# Patient Record
Sex: Female | Born: 1970
Health system: Southern US, Community
[De-identification: ages and names within clinical notes are randomized; demographics above are authoritative.]

## PROBLEM LIST (undated history)

## (undated) DIAGNOSIS — R9439 Abnormal result of other cardiovascular function study: Secondary | ICD-10-CM

## (undated) DIAGNOSIS — F419 Anxiety disorder, unspecified: Secondary | ICD-10-CM

## (undated) DIAGNOSIS — E559 Vitamin D deficiency, unspecified: Secondary | ICD-10-CM

## (undated) DIAGNOSIS — R0789 Other chest pain: Secondary | ICD-10-CM

## (undated) DIAGNOSIS — F329 Major depressive disorder, single episode, unspecified: Secondary | ICD-10-CM

## (undated) DIAGNOSIS — H539 Unspecified visual disturbance: Secondary | ICD-10-CM

## (undated) DIAGNOSIS — R079 Chest pain, unspecified: Secondary | ICD-10-CM

## (undated) DIAGNOSIS — R51 Headache: Secondary | ICD-10-CM

## (undated) DIAGNOSIS — E6609 Other obesity due to excess calories: Secondary | ICD-10-CM

## (undated) DIAGNOSIS — R0609 Other forms of dyspnea: Secondary | ICD-10-CM

## (undated) DIAGNOSIS — R0600 Dyspnea, unspecified: Secondary | ICD-10-CM

## (undated) DIAGNOSIS — I10 Essential (primary) hypertension: Secondary | ICD-10-CM

## (undated) DIAGNOSIS — R55 Syncope and collapse: Secondary | ICD-10-CM

## (undated) DIAGNOSIS — E66812 Obesity, class 2: Secondary | ICD-10-CM

## (undated) DIAGNOSIS — R519 Headache, unspecified: Secondary | ICD-10-CM

## (undated) DIAGNOSIS — Z6838 Body mass index (BMI) 38.0-38.9, adult: Secondary | ICD-10-CM

## (undated) HISTORY — DX: Headache, unspecified: R51.9

## (undated) HISTORY — DX: Headache: R51

## (undated) HISTORY — PX: WISDOM TOOTH EXTRACTION: SHX21

## (undated) HISTORY — DX: Major depressive disorder, single episode, unspecified: F32.9

## (undated) HISTORY — DX: Vitamin D deficiency, unspecified: E55.9

## (undated) HISTORY — DX: Body mass index (BMI) 38.0-38.9, adult: Z68.38

## (undated) HISTORY — DX: Unspecified visual disturbance: H53.9

## (undated) HISTORY — DX: Other chest pain: R07.89

## (undated) HISTORY — PX: BREAST REDUCTION SURGERY: SHX8

## (undated) HISTORY — DX: Other forms of dyspnea: R06.09

## (undated) HISTORY — DX: Essential (primary) hypertension: I10

## (undated) HISTORY — DX: Anxiety disorder, unspecified: F41.9

## (undated) HISTORY — PX: REDUCTION MAMMAPLASTY: SUR839

## (undated) HISTORY — DX: Other obesity due to excess calories: E66.09

## (undated) HISTORY — DX: Chest pain, unspecified: R07.9

## (undated) HISTORY — DX: Dyspnea, unspecified: R06.00

## (undated) HISTORY — DX: Abnormal result of other cardiovascular function study: R94.39

## (undated) HISTORY — DX: Obesity, class 2: E66.812

## (undated) HISTORY — DX: Syncope and collapse: R55

---

## 1998-10-22 ENCOUNTER — Emergency Department (HOSPITAL_COMMUNITY): Admission: EM | Admit: 1998-10-22 | Discharge: 1998-10-22 | Payer: Self-pay

## 2003-07-24 ENCOUNTER — Inpatient Hospital Stay (HOSPITAL_COMMUNITY): Admission: AD | Admit: 2003-07-24 | Discharge: 2003-07-24 | Payer: Self-pay | Admitting: Obstetrics and Gynecology

## 2003-11-30 ENCOUNTER — Inpatient Hospital Stay (HOSPITAL_COMMUNITY): Admission: AD | Admit: 2003-11-30 | Discharge: 2003-11-30 | Payer: Self-pay | Admitting: Obstetrics & Gynecology

## 2004-02-04 ENCOUNTER — Inpatient Hospital Stay (HOSPITAL_COMMUNITY): Admission: AD | Admit: 2004-02-04 | Discharge: 2004-02-04 | Payer: Self-pay | Admitting: Obstetrics & Gynecology

## 2009-07-08 ENCOUNTER — Emergency Department (HOSPITAL_COMMUNITY): Admission: EM | Admit: 2009-07-08 | Discharge: 2009-07-08 | Payer: Self-pay | Admitting: Emergency Medicine

## 2009-07-22 ENCOUNTER — Encounter: Admission: RE | Admit: 2009-07-22 | Discharge: 2009-07-22 | Payer: Self-pay | Admitting: Family Medicine

## 2009-08-23 ENCOUNTER — Ambulatory Visit (HOSPITAL_COMMUNITY): Admission: RE | Admit: 2009-08-23 | Discharge: 2009-08-23 | Payer: Self-pay | Admitting: Gastroenterology

## 2010-04-25 LAB — URINALYSIS, ROUTINE W REFLEX MICROSCOPIC
Bilirubin Urine: NEGATIVE
Glucose, UA: NEGATIVE mg/dL
Ketones, ur: NEGATIVE mg/dL
Leukocytes, UA: NEGATIVE
Nitrite: NEGATIVE
Protein, ur: 30 mg/dL — AB
Specific Gravity, Urine: 1.005 (ref 1.005–1.030)
Urobilinogen, UA: 0.2 mg/dL (ref 0.0–1.0)
pH: 6 (ref 5.0–8.0)

## 2010-04-25 LAB — DIFFERENTIAL
Basophils Absolute: 0.1 10*3/uL (ref 0.0–0.1)
Eosinophils Absolute: 0.1 10*3/uL (ref 0.0–0.7)
Eosinophils Relative: 1 % (ref 0–5)
Lymphocytes Relative: 15 % (ref 12–46)
Lymphs Abs: 2 10*3/uL (ref 0.7–4.0)
Monocytes Absolute: 1.1 10*3/uL — ABNORMAL HIGH (ref 0.1–1.0)

## 2010-04-25 LAB — BASIC METABOLIC PANEL
BUN: 22 mg/dL (ref 6–23)
Chloride: 101 mEq/L (ref 96–112)
GFR calc non Af Amer: 23 mL/min — ABNORMAL LOW (ref >60)
Glucose, Bld: 101 mg/dL — ABNORMAL HIGH (ref 70–99)
Potassium: 4.7 mEq/L (ref 3.5–5.1)
Sodium: 133 mEq/L — ABNORMAL LOW (ref 135–145)

## 2010-04-25 LAB — URINE MICROSCOPIC-ADD ON

## 2010-04-25 LAB — CBC
HCT: 37.7 % (ref 36.0–46.0)
Hemoglobin: 13 g/dL (ref 12.0–15.0)
MCV: 90 fL (ref 78.0–100.0)
Platelets: 256 10*3/uL (ref 150–400)
RDW: 13.1 % (ref 11.5–15.5)

## 2011-05-28 IMAGING — NM NM HEPATO W/GB/PHARM/[PERSON_NAME]
2 series · 12 of 12 positions shown · non-contrast
Comparison: None.

CLINICAL DATA: Abdominal pain

NUCLEAR MEDICINE HEPATOBILIARY IMAGING WITH GALLBLADDER EF
TECHNIQUE: Sequential images of the abdomen were obtained [DATE] minutes following intravenous administration of
radiopharmaceutical.  After slow intravenous infusion of
micrograms Cholecystokinin, gallbladder ejection fraction was
determined.
Radiopharmaceutical:  5.3 mCi Wc-VVm Choletec

[Series 1: he hepato · 4.75mm/px · 6 of 60 frames shown (1 of 2)]
[frame 6/60]
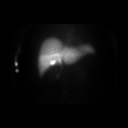
[frame 16/60]
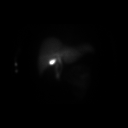
[frame 26/60]
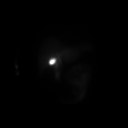
[frame 36/60]
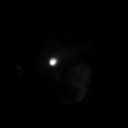
[frame 46/60]
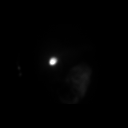
[frame 56/60]
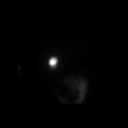

[Series 1: he hepato · 4.72mm/px · 6 of 30 frames shown (2 of 2)]
[frame 3/30]
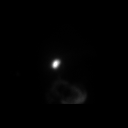
[frame 8/30]
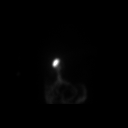
[frame 13/30]
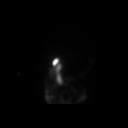
[frame 18/30]
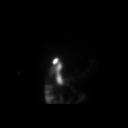
[frame 23/30]
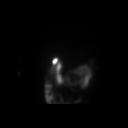
[frame 28/30]
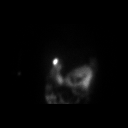

[12 of 12 positions shown; findings below may reference images not displayed]

FINDINGS: There is prompt visualization of the biliary tree,
gallbladder, and small bowel.

Gallbladder ejection fraction is estimated at 73.8%.  Normal is
greater than 30%.

The patient did experience symptoms during CCK infusion.
IMPRESSION: 1.  Patent cystic and common bile ducts.
2.  The gallbladder contracts physiologically.
3.  Subjectively, the patient reported pain during CCK infusion.

## 2011-05-28 IMAGING — US US ABDOMEN COMPLETE
1 series · 14 of 25 positions shown · non-contrast
Comparison: [HOSPITAL] at [HOSPITAL] abdominal pelvic
CT 07/22/2009.

CLINICAL DATA: Abdominal pain since [DATE].

COMPLETE ABDOMINAL ULTRASOUND

[Series 1: us abdomen complete · 0.30mm/px · 14 of 78 slices shown]
[im 1/78]
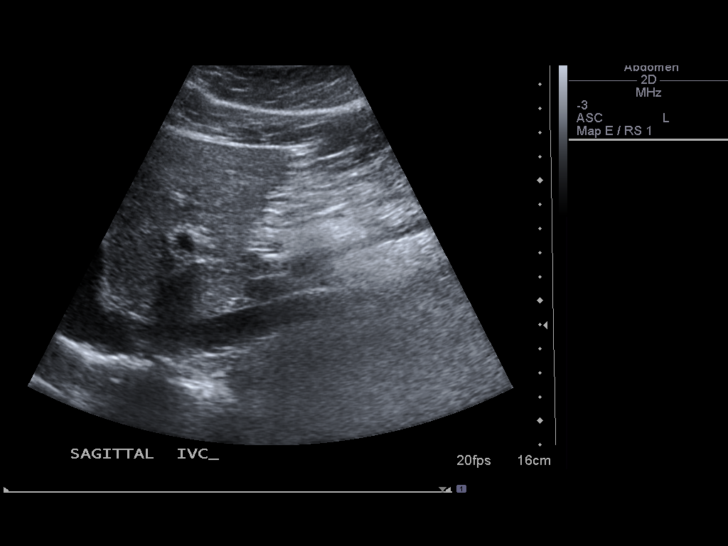
[im 7/78]
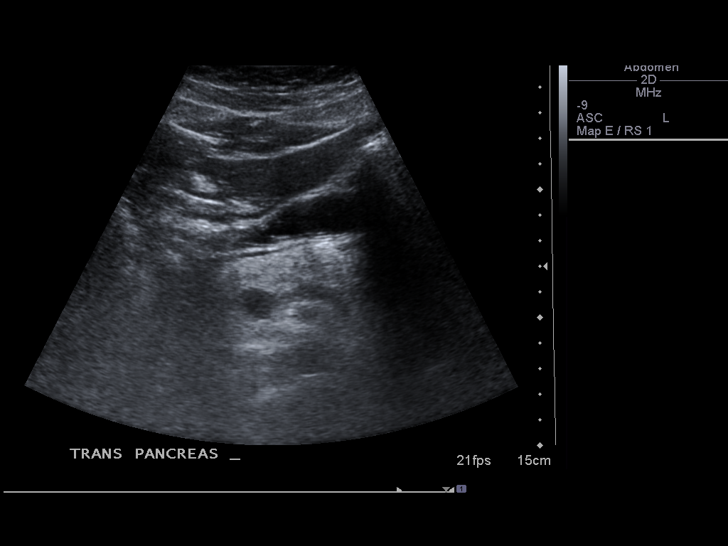
[im 13/78]
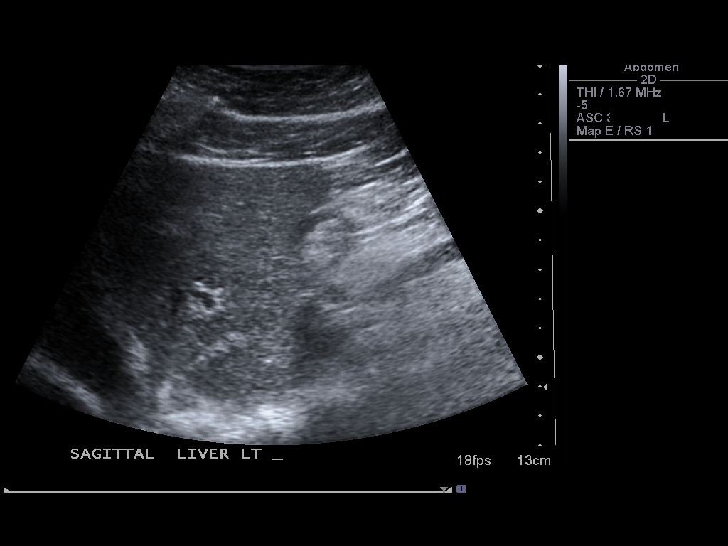
[im 20/78]
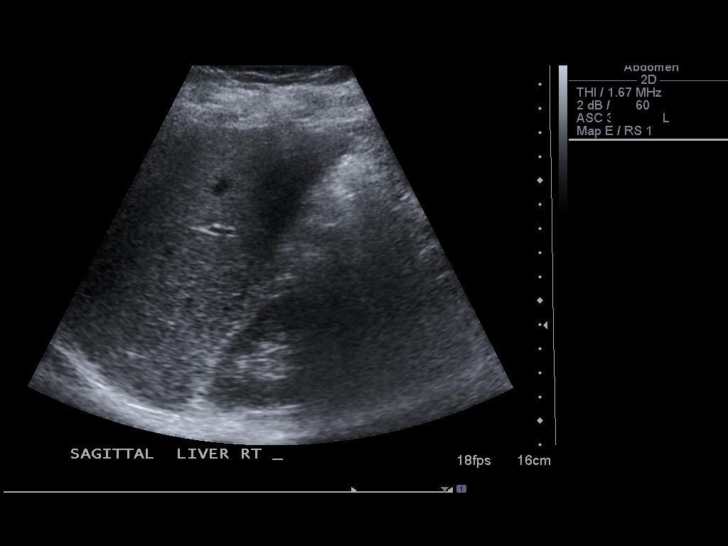
[im 26/78]
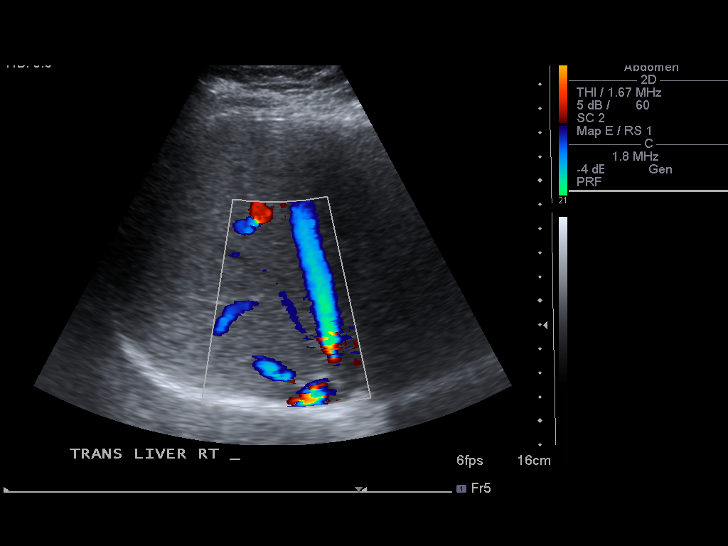
[im 29/78]
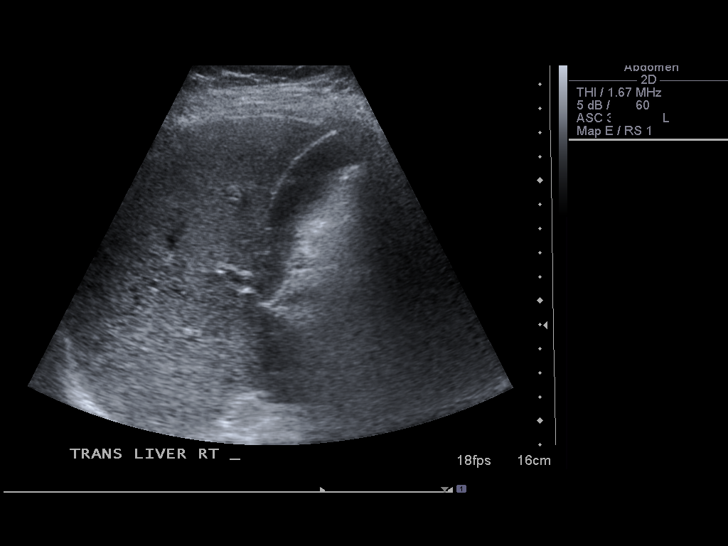
[im 36/78]
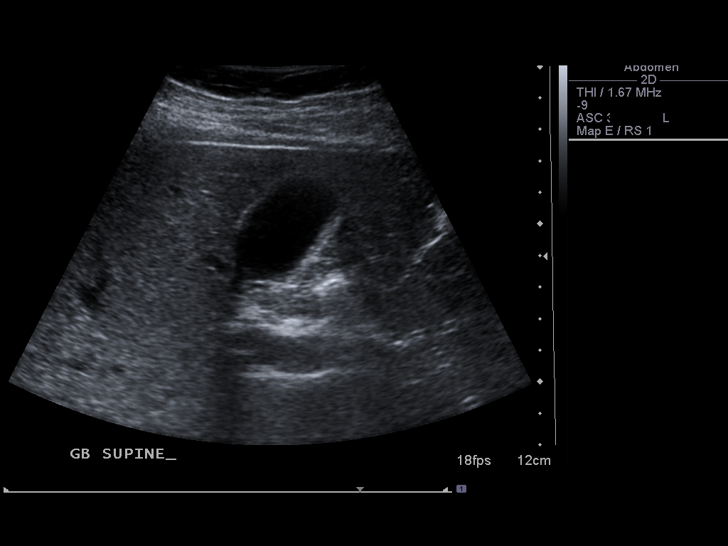
[im 42/78]
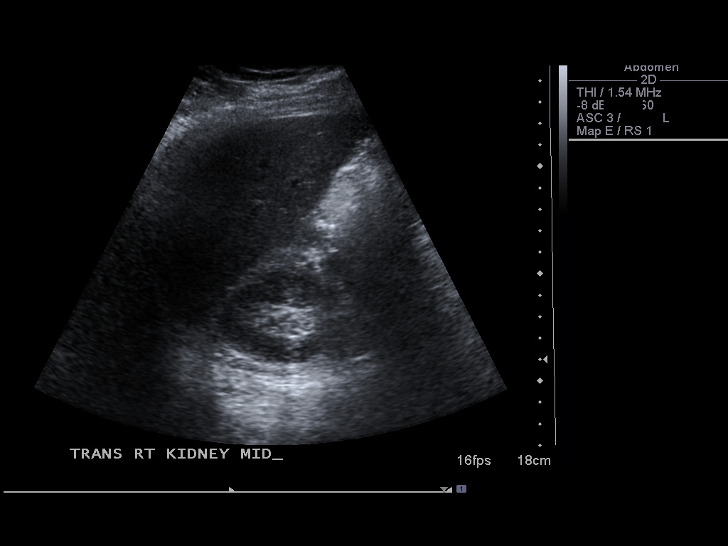
[im 49/78]
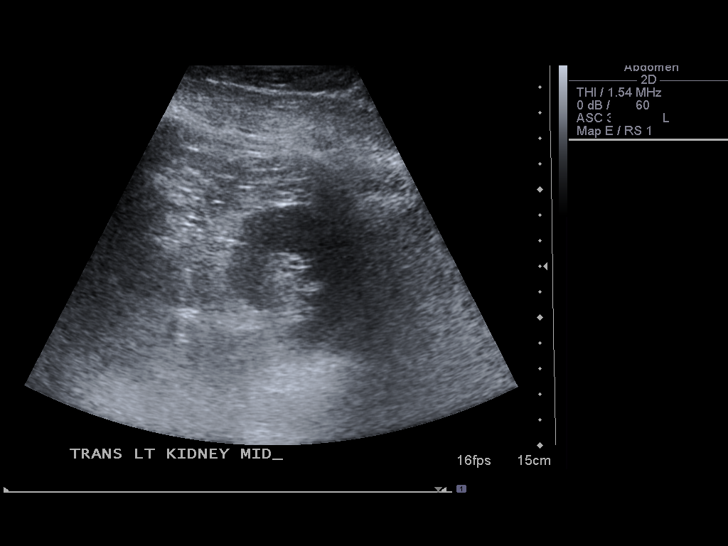
[im 52/78]
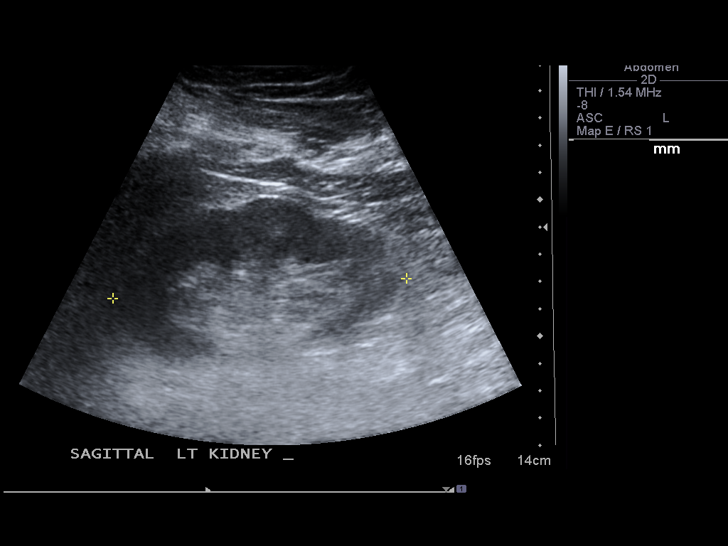
[im 58/78]
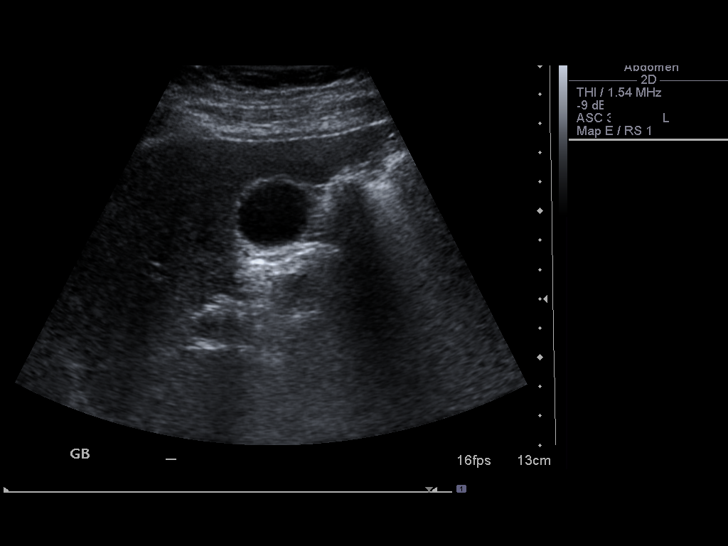
[im 65/78]
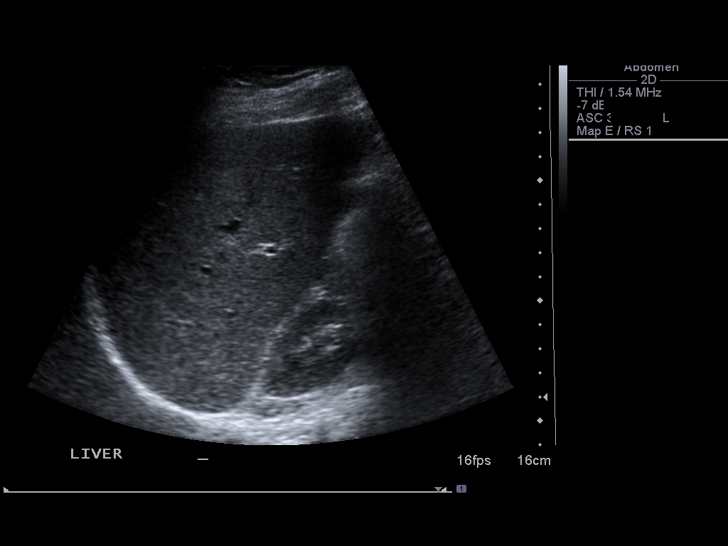
[im 71/78]
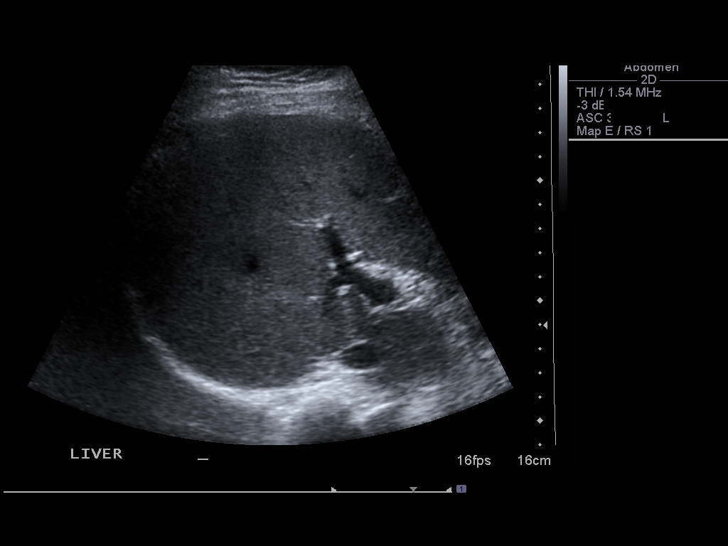
[im 78/78]
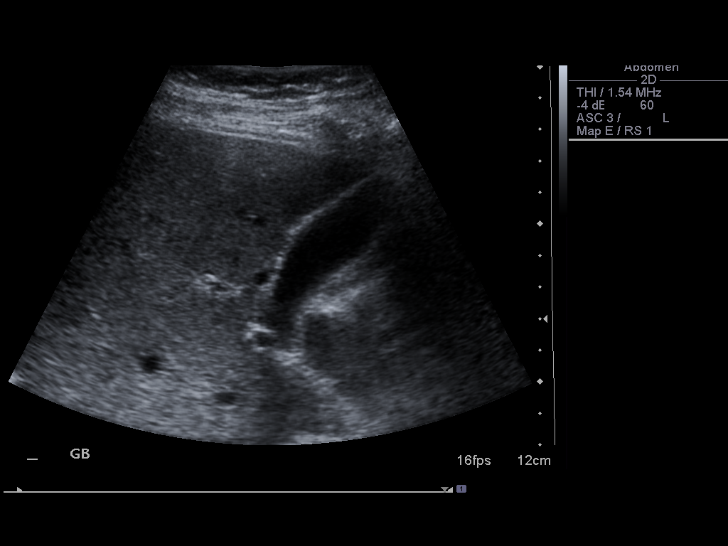

[14 of 25 positions shown; findings below may reference images not displayed]

FINDINGS: Gallbladder:  Sonographically normal without sludge gallstones with
normal 2 mm wall thickness and no sonographic Murphy's sign evoked.

Common bile duct:  No dilated intrahepatic or exophytic bile ducts
with common bile duct measuring normally at 2 mm.

Liver:  Remains normal-appearing.

IVC:  Appears normal.

Pancreas:  Pancreatic tail not visualized due to overlying gastro
intestinal gas with stable 1.5 cm AP thickness pancreatic body with
no focal lesion.

Spleen:  Sonographically normal measuring 7.3 cm long.

Right Kidney:  Sonographically normal measuring 11.9 cm long.

Left Kidney:  Sonographically normal measuring 10.8 cm long with
stable lateral fetal lobulation of contour.

Abdominal aorta:  No aneurysm identified with maximum proximal
diameter 2.1 cm.

No free fluid noted.
IMPRESSION: 1.  Nonvisualization pancreatic tail.
2.  Otherwise stable - normal.

## 2011-08-23 ENCOUNTER — Other Ambulatory Visit: Payer: Self-pay | Admitting: Family Medicine

## 2011-08-23 DIAGNOSIS — N6320 Unspecified lump in the left breast, unspecified quadrant: Secondary | ICD-10-CM

## 2011-08-28 ENCOUNTER — Other Ambulatory Visit: Payer: Self-pay

## 2011-08-29 ENCOUNTER — Ambulatory Visit
Admission: RE | Admit: 2011-08-29 | Discharge: 2011-08-29 | Disposition: A | Discharge status: Home / Self Care | Payer: Managed Care, Other (non HMO) | Source: Ambulatory Visit | Attending: Family Medicine | Admitting: Family Medicine

## 2011-08-29 DIAGNOSIS — N6320 Unspecified lump in the left breast, unspecified quadrant: Secondary | ICD-10-CM

## 2011-12-12 ENCOUNTER — Emergency Department (HOSPITAL_COMMUNITY)
Admission: EM | Admit: 2011-12-12 | Discharge: 2011-12-12 | Disposition: A | Discharge status: Home / Self Care | Payer: Managed Care, Other (non HMO) | Attending: Emergency Medicine | Admitting: Emergency Medicine

## 2011-12-12 ENCOUNTER — Encounter (HOSPITAL_COMMUNITY): Payer: Self-pay | Admitting: *Deleted

## 2011-12-12 ENCOUNTER — Emergency Department (HOSPITAL_COMMUNITY): Payer: Managed Care, Other (non HMO)

## 2011-12-12 DIAGNOSIS — J069 Acute upper respiratory infection, unspecified: Secondary | ICD-10-CM | POA: Insufficient documentation

## 2011-12-12 DIAGNOSIS — Z79899 Other long term (current) drug therapy: Secondary | ICD-10-CM | POA: Insufficient documentation

## 2011-12-12 DIAGNOSIS — J029 Acute pharyngitis, unspecified: Secondary | ICD-10-CM | POA: Insufficient documentation

## 2011-12-12 DIAGNOSIS — R42 Dizziness and giddiness: Secondary | ICD-10-CM

## 2011-12-12 DIAGNOSIS — F17200 Nicotine dependence, unspecified, uncomplicated: Secondary | ICD-10-CM | POA: Insufficient documentation

## 2011-12-12 DIAGNOSIS — R509 Fever, unspecified: Secondary | ICD-10-CM | POA: Insufficient documentation

## 2011-12-12 LAB — BASIC METABOLIC PANEL
BUN: 6 mg/dL (ref 6–23)
Calcium: 9.5 mg/dL (ref 8.4–10.5)
Creatinine, Ser: 0.75 mg/dL (ref 0.50–1.10)
GFR calc non Af Amer: >90 mL/min (ref >90)
Glucose, Bld: 74 mg/dL (ref 70–99)

## 2011-12-12 LAB — CBC WITH DIFFERENTIAL/PLATELET
Basophils Relative: 0 % (ref 0–1)
Eosinophils Absolute: 0.1 10*3/uL (ref 0.0–0.7)
Eosinophils Relative: 1 % (ref 0–5)
Hemoglobin: 14.4 g/dL (ref 12.0–15.0)
Lymphs Abs: 2.1 10*3/uL (ref 0.7–4.0)
MCH: 32.4 pg (ref 26.0–34.0)
MCHC: 34.4 g/dL (ref 30.0–36.0)
MCV: 94.2 fL (ref 78.0–100.0)
Monocytes Relative: 10 % (ref 3–12)
RBC: 4.45 MIL/uL (ref 3.87–5.11)

## 2011-12-12 MED ORDER — IPRATROPIUM BROMIDE 0.03 % NA SOLN: 2.0000 | Freq: Two times a day (BID) | NASAL | Status: DC

## 2011-12-12 MED ORDER — SODIUM CHLORIDE 0.9 % IV SOLN
Freq: Once | INTRAVENOUS | Status: AC
  Administered 2011-12-12: 17:00:00 via INTRAVENOUS

## 2011-12-12 MED ORDER — GUAIFENESIN ER 600 MG PO TB12: 1200.0000 mg | ORAL_TABLET | Freq: Two times a day (BID) | ORAL | Status: DC

## 2011-12-12 MED ORDER — BENZONATATE 100 MG PO CAPS: 200.0000 mg | ORAL_CAPSULE | Freq: Two times a day (BID) | ORAL | Status: DC | PRN

## 2011-12-12 MED ORDER — SODIUM CHLORIDE 0.9 % IV BOLUS (SEPSIS)
1000.0000 mL | Freq: Once | INTRAVENOUS | Status: AC
  Administered 2011-12-12: 1000 mL via INTRAVENOUS

## 2011-12-12 NOTE — ED Notes (Signed)
Meal tray given.  Provided pt and husband with blankets.

## 2011-12-12 NOTE — ED Notes (Signed)
Pt was sick for 2 days with sore throat headache, cough, congestion, fever, dizzy with standing.  [facility] and bp was in the 80s systolic.  Referred here.

## 2011-12-12 NOTE — ED Notes (Signed)
Pt currently denies pain.  

## 2011-12-12 NOTE — ED Notes (Signed)
Pt reports feeling dizziness upon standing after having fevers for the past few days.

## 2011-12-12 NOTE — ED Provider Notes (Signed)
History     CSN: [id number]  Arrival date & time 12/12/11  1222   First MD Initiated Contact with Patient 12/12/11 1511      Chief Complaint  Patient presents with  . Hypotension    (Consider location/radiation/quality/duration/timing/severity/associated sxs/prior treatment) The history is provided by the patient, medical records and the spouse.    Karen Mcdonald is a 41 y.o. female [facility] c/o low BP.  Pt states the symptoms began gradually 2 days ago, have been persistent and gradually worsened.  Today she went to the [facility] to be evaluated and was found to have a BP of 80/60 in the clinic.  Her PCP, Selena Batten with [facility], was contacted and she was told to report here to the ED.  Pt has associated headache, sore throat, sinus congestion, rhinorrhea, productive cough and subjective fever for the last 2 days.  She has been taking Nyquil and Robitussin with minimal symptom relief and nothing makes it worse.  She denies chest pain, shortness of breath, abdominal pain, nausea, vomiting, diarrhea, weakness, numbness or syncopal episodes.  Pt states she does feel dizzy when she stands, but does not have problems when she is sitting or lying.    History reviewed. No pertinent past medical history.  History reviewed. No pertinent past surgical history.  No family history on file.  History  Substance Use Topics  . Smoking status: Current Every Day Smoker  . Smokeless tobacco: Not on file  . Alcohol Use:      Comment: occ    OB History    Grav Para Term Preterm Abortions TAB SAB Ect Mult Living                  Review of Systems  Constitutional: Positive for fever and chills. Negative for diaphoresis, appetite change, fatigue and unexpected weight change.  HENT: Positive for congestion, sore throat, rhinorrhea, postnasal drip and sinus pressure. Negative for ear pain, nosebleeds, sneezing, mouth sores, trouble swallowing,  neck pain, neck stiffness and dental problem.   Eyes: Negative for visual disturbance.  Respiratory: Positive for cough. Negative for chest tightness, shortness of breath and wheezing.   Cardiovascular: Negative for chest pain.  Gastrointestinal: Negative for nausea, vomiting, abdominal pain, diarrhea and constipation.  Genitourinary: Negative for dysuria, urgency, frequency and hematuria.  Musculoskeletal: Negative for back pain.  Skin: Negative for rash.  Neurological: Negative for syncope, light-headedness and headaches.  Hematological: Does not bruise/bleed easily.  Psychiatric/Behavioral: Negative for sleep disturbance. The patient is not nervous/anxious.   All other systems reviewed and are negative.    Allergies  Erythromycin and Latex  Home Medications   Current Outpatient Rx  Name  Route  Sig  Dispense  Refill  . VITAMIN B-12 PO   Oral   Take 1 tablet by mouth daily.         [facility]   Both Eyes   Place 1 drop into both eyes every morning.         Marland Kitchen BENZONATATE 100 MG PO CAPS   Oral   Take 2 capsules (200 mg total) by mouth 2 (two) times daily as needed for cough.   20 capsule   0   . GUAIFENESIN ER 600 MG PO TB12   Oral   Take 2 tablets (1,200 mg total) by mouth 2 (two) times daily.   40 tablet   0   . IPRATROPIUM BROMIDE 0.03 % NA SOLN  Nasal   Place 2 sprays into the nose 2 (two) times daily. PRN congestion   30 mL   0     BP 105/59  Pulse 83  Temp 98 F (36.7 C) (Oral)  Resp 16  SpO2 100%  LMP 12/04/2011  Physical Exam  Nursing note and vitals reviewed. Constitutional: She is oriented to person, place, and time. She appears well-developed and well-nourished. No distress.  HENT:  Head: Normocephalic and atraumatic.  Right Ear: Tympanic membrane, external ear and ear canal normal.  Left Ear: Tympanic membrane, external ear and ear canal normal.  Nose: Mucosal edema and rhinorrhea present. No sinus tenderness or nasal deformity. No  epistaxis. Right sinus exhibits no maxillary sinus tenderness and no frontal sinus tenderness. Left sinus exhibits no maxillary sinus tenderness and no frontal sinus tenderness.  Mouth/Throat: Uvula is midline. Mucous membranes are not pale and not cyanotic. Posterior oropharyngeal erythema present. No oropharyngeal exudate, posterior oropharyngeal edema or tonsillar abscesses.  Eyes: Conjunctivae normal are normal. Pupils are equal, round, and reactive to light. No scleral icterus.  Neck: Normal range of motion. Neck supple.  Cardiovascular: Normal rate, regular rhythm, normal heart sounds and intact distal pulses.  Exam reveals no gallop and no friction rub.   No murmur heard. Pulmonary/Chest: Effort normal and breath sounds normal. No respiratory distress. She has no wheezes. She has no rales. She exhibits no tenderness.  Abdominal: Soft. Bowel sounds are normal. She exhibits no mass. There is no tenderness. There is no rebound and no guarding.  Musculoskeletal: Normal range of motion. She exhibits no edema.  Lymphadenopathy:    She has no cervical adenopathy.  Neurological: She is alert and oriented to person, place, and time. She exhibits normal muscle tone. Coordination normal.       Speech is clear and goal oriented Moves extremities without ataxia  Skin: Skin is warm and dry. No rash noted. She is not diaphoretic.  Psychiatric: She has a normal mood and affect.    ED Course  Procedures (including critical care time)   Labs Reviewed  RAPID STREP SCREEN  CBC WITH DIFFERENTIAL  BASIC METABOLIC PANEL   Dg Chest 2 View  12/12/2011  *RADIOLOGY REPORT*  Clinical Data: Cough, headache, dizzy  CHEST - 2 VIEW  Comparison: 07/08/2009  Findings: Cardiomediastinal silhouette is stable.  No acute infiltrate or pleural effusion.  No pulmonary edema.  Bony thorax is stable.  IMPRESSION: No active disease.   Original Report Authenticated By: Natasha Mead, M.D.    ECG:  Date: 12/12/2011  Rate:  86  Rhythm: normal sinus rhythm  QRS Axis: normal  Intervals: normal  ST/T Wave abnormalities: normal  Conduction Disutrbances:none  Narrative Interpretation: NSR, nonischemic ECG  Old EKG Reviewed: none available    1. URI (upper respiratory infection)   2. Orthostatic lightheadedness       MDM  Deannie Resetar presents with reports of low BP.  BP WNL throughout time here.  Rapid Strep negative, Centor Criteria Score 1.  Pt alert and oriented, NAD, non-toxic, nonseptic appearing, afebrile, nontachycardic.  Low risk for PE based on the Well's Criteria and PERC negative.  CBC, BMP unremarkable.  CXR without evidence of infiltrate or edema.  Patient given 2 L of normal saline with improvement in blood pressure. Patient able to ambulate without symptoms of dizziness and/or lightheadedness.  Patients symptoms are consistent with URI, likely viral etiology. Discussed that antibiotics are not indicated for viral infections. Pt will be discharged with symptomatic treatment.  Verbalizes understanding and is agreeable with plan. Pt is hemodynamically stable & in NAD prior to dc.   1. Medications: Atrovent nasal spray, Mucinex, Tessalon Perles 2. Treatment: Rest, drink plenty of fluids, take medications as prescribed, Tylenol or Motrin for pain or fever 3. Follow Up: Follow up with your primary care physician this week if symptoms do not resolve       Dierdre Forth, PA-C 12/12/11 1945

## 2011-12-12 NOTE — ED Notes (Signed)
Dahlia Client Muthersbaugh, PA notified about pts orthostatic vital signs.

## 2011-12-12 NOTE — ED Notes (Signed)
Pt ambulatory leaving ED with significant other. Pt given d/c teaching and prescriptions. Pt has no further questions upon d/c. Pt does not appear to be in any acute distress.

## 2011-12-12 NOTE — ED Notes (Signed)
Pt denies nausea, blurred vision. Pt ambulated to bathroom. Pt states gets lightheaded and dizzy when gets up to walk.

## 2011-12-12 NOTE — ED Notes (Signed)
Pt tolerated orthostatic vitals signs. Pt states "a little" dizziness upon standing.

## 2011-12-13 NOTE — ED Provider Notes (Signed)
Medical screening examination/treatment/procedure(s) were performed by non-physician practitioner and as supervising physician I was immediately available for consultation/collaboration.  Jones Skene, M.D.     Jones Skene, MD 12/13/11 0230

## 2012-07-23 ENCOUNTER — Other Ambulatory Visit: Payer: Self-pay

## 2012-07-23 DIAGNOSIS — Z1231 Encounter for screening mammogram for malignant neoplasm of breast: Secondary | ICD-10-CM

## 2012-08-29 ENCOUNTER — Ambulatory Visit: Payer: Managed Care, Other (non HMO)

## 2012-09-13 ENCOUNTER — Ambulatory Visit
Admission: RE | Admit: 2012-09-13 | Discharge: 2012-09-13 | Disposition: A | Discharge status: Home / Self Care | Payer: BC Managed Care – PPO | Source: Ambulatory Visit

## 2012-09-13 DIAGNOSIS — Z1231 Encounter for screening mammogram for malignant neoplasm of breast: Secondary | ICD-10-CM

## 2013-08-04 ENCOUNTER — Other Ambulatory Visit: Payer: Self-pay

## 2013-08-04 DIAGNOSIS — Z1231 Encounter for screening mammogram for malignant neoplasm of breast: Secondary | ICD-10-CM

## 2013-09-15 ENCOUNTER — Ambulatory Visit
Admission: RE | Admit: 2013-09-15 | Discharge: 2013-09-15 | Disposition: A | Discharge status: Home / Self Care | Payer: BC Managed Care – PPO | Source: Ambulatory Visit

## 2013-09-15 DIAGNOSIS — Z1231 Encounter for screening mammogram for malignant neoplasm of breast: Secondary | ICD-10-CM

## 2014-05-20 ENCOUNTER — Telehealth: Payer: Self-pay | Admitting: Internal Medicine

## 2014-05-20 NOTE — Telephone Encounter (Signed)
Received records from [facility] for appointment with Dr Rennis GoldenHilty on 06/18/14.  [facility] Dr Blanchie DessertHilty's schedule on 06/18/14.

## 2014-05-21 ENCOUNTER — Ambulatory Visit (INDEPENDENT_AMBULATORY_CARE_PROVIDER_SITE_OTHER): Payer: BLUE CROSS/BLUE SHIELD | Admitting: Cardiovascular Disease

## 2014-05-21 ENCOUNTER — Encounter: Payer: Self-pay | Admitting: Cardiovascular Disease

## 2014-05-21 VITALS — BP 128/88 | HR 69 | Ht 65.0 in | Wt 199.2 lb

## 2014-05-21 DIAGNOSIS — R0602 Shortness of breath: Secondary | ICD-10-CM | POA: Diagnosis not present

## 2014-05-21 DIAGNOSIS — R55 Syncope and collapse: Secondary | ICD-10-CM | POA: Insufficient documentation

## 2014-05-21 DIAGNOSIS — R079 Chest pain, unspecified: Secondary | ICD-10-CM | POA: Diagnosis not present

## 2014-05-21 DIAGNOSIS — R0789 Other chest pain: Secondary | ICD-10-CM | POA: Diagnosis not present

## 2014-05-21 DIAGNOSIS — R0609 Other forms of dyspnea: Secondary | ICD-10-CM

## 2014-05-21 NOTE — Assessment & Plan Note (Signed)
Miss Karen Mcdonald complains of atypical chest pain beginning 6 months ago some chest heaviness. She has no critical risk factors other than smoking one half pack per day. She does have anxiety attacks and this may be related to that. I'm going to get a routine GXT to rule out an ischemic etiology.

## 2014-05-21 NOTE — Assessment & Plan Note (Signed)
The patient relates several episodes of syncope some of which sound orthostatic when she gets up quickly and others occur when she is standing, she gets dizzy and then finds herself on the floor. She may have neurocardiogenic syncope. We talked about not driving for 6 months. This may need to be further evaluated.

## 2014-05-21 NOTE — Assessment & Plan Note (Signed)
The patient has increasing dyspnea on exertion over the last 6 months. She has put on 20 pounds over the last year. I'm going to get a 2-D echo to evaluate LV function.

## 2014-05-21 NOTE — Patient Instructions (Signed)
Dr Allyson SabalBerry has ordered the following test(s) to be done: 1. Treadmill Stress test - For further information please visit [email or web address]. Please also follow instruction sheet, as given. 2. 2D Echocardiogram - Echocardiography is a painless test that uses sound waves to create images of your heart. It provides your doctor with information about the size and shape of your heart and how well your heart's chambers and valves are working. This procedure takes approximately one hour. There are no restrictions for this procedure. 3. Blood work - to be done at your EARLIEST CONVENIENCE and FASTING  Dr Allyson SabalBerry wants you to follow-up after you have completed your tests.

## 2014-05-21 NOTE — Progress Notes (Signed)
05/21/2014 Karen Mcdonald   Dec 27, 1970  [id number]  Primary Physician No primary care provider on file. Primary Cardiologist: Runell Gess MD Roseanne Reno   HPI:  Karen Mcdonald is a 44 year old moderately overweight single Caucasian female who lives with her significant other and has 2 stepchildren. She was referred by Dr. Rana Snare, her OB/GYN for evaluation of atypical chest pain and dyspnea on exertion for the last 6 months. She does have an anxiety disorder. She works at [employer] in [employer]. Her cardiac risk factor profile is notable for one half pack per day tobacco abuse. Otherwise is negative. She complains of increasing dyspnea on exertion over the last 6 months atypical chest pain. She also has some episodes of lightheadedness and loss of consciousness, which sound orthostatic in nature.   Current Outpatient Prescriptions  Medication Sig Dispense Refill  . escitalopram (LEXAPRO) 10 MG tablet Take 10 mg by mouth daily.    . Norethindrone-Ethinyl Estradiol-Fe Biphas (LO LOESTRIN FE) 1 MG-10 MCG / 10 MCG tablet Take 1 tablet by mouth daily.     No current facility-administered medications for this visit.    Allergies  Allergen Reactions  . Erythromycin Nausea And Vomiting  . Latex Swelling    History   Social History  . Marital Status: Single    Spouse Name: N/A  . Number of Children: N/A  . Years of Education: N/A   Occupational History  . Not on file.   Social History Main Topics  . Smoking status: Current Every Day Smoker  . Smokeless tobacco: Not on file  . Alcohol Use: Not on file     Comment: occ  . Drug Use: No  . Sexual Activity: Not on file   Other Topics Concern  . Not on file   Social History Narrative     Review of Systems: General: negative for chills, fever, night sweats or weight changes.  Cardiovascular: negative for chest pain, dyspnea on exertion, edema, orthopnea, palpitations, paroxysmal nocturnal dyspnea  or shortness of breath Dermatological: negative for rash Respiratory: negative for cough or wheezing Urologic: negative for hematuria Abdominal: negative for nausea, vomiting, diarrhea, bright red blood per rectum, melena, or hematemesis Neurologic: negative for visual changes, syncope, or dizziness All other systems reviewed and are otherwise negative except as noted above.    Blood pressure 128/88, pulse 69, height  (1.651 m), weight 199 lb 3.2 oz (90.357 kg).  General appearance: alert and no distress Neck: no adenopathy, no carotid bruit, no JVD, supple, symmetrical, trachea midline and thyroid not enlarged, symmetric, no tenderness/mass/nodules Lungs: clear to auscultation bilaterally Heart: regular rate and rhythm, S1, S2 normal, no murmur, click, rub or gallop Extremities: extremities normal, atraumatic, no cyanosis or edema  EKG normal sinus rhythm at 69 without ST or T-wave changes. I personally reviewed this EKG  ASSESSMENT AND PLAN:   Dyspnea on exertion The patient has increasing dyspnea on exertion over the last 6 months. She has put on 20 pounds over the last year. I'm going to get a 2-D echo to evaluate LV function.   Atypical chest pain Karen Mcdonald complains of atypical chest pain beginning 6 months ago some chest heaviness. She has no critical risk factors other than smoking one half pack per day. She does have anxiety attacks and this may be related to that. I'm going to get a routine GXT to rule out an ischemic etiology.   Syncope The patient relates several episodes of syncope some  of which sound orthostatic when she gets up quickly and others occur when she is standing, she gets dizzy and then finds herself on the floor. She may have neurocardiogenic syncope. We talked about not driving for 6 months. This may need to be further evaluated.       Runell GessJonathan J. Berry MD FACP,FACC,FAHA, [facility] 05/21/2014 5:23 PM

## 2014-05-22 LAB — LIPID PANEL
Cholesterol: 179 mg/dL (ref 0–200)
HDL: 40 mg/dL — AB (ref >=46)
LDL CALC: 102 mg/dL — AB (ref 0–99)
Total CHOL/HDL Ratio: 4.5 Ratio
Triglycerides: 185 mg/dL — ABNORMAL HIGH (ref <150)
VLDL: 37 mg/dL (ref 0–40)

## 2014-05-22 LAB — HEPATIC FUNCTION PANEL
ALBUMIN: 4.2 g/dL (ref 3.5–5.2)
ALT: 31 U/L (ref 0–35)
AST: 23 U/L (ref 0–37)
Alkaline Phosphatase: 75 U/L (ref 39–117)
BILIRUBIN INDIRECT: 0.5 mg/dL (ref 0.2–1.2)
Bilirubin, Direct: 0.1 mg/dL (ref 0.0–0.3)
Total Bilirubin: 0.6 mg/dL (ref 0.2–1.2)
Total Protein: 6.8 g/dL (ref 6.0–8.3)

## 2014-05-25 ENCOUNTER — Encounter: Payer: Self-pay | Admitting: *Deleted

## 2014-06-01 ENCOUNTER — Other Ambulatory Visit: Payer: Self-pay | Admitting: Cardiovascular Disease

## 2014-06-01 DIAGNOSIS — R079 Chest pain, unspecified: Secondary | ICD-10-CM

## 2014-06-01 DIAGNOSIS — R0602 Shortness of breath: Secondary | ICD-10-CM

## 2014-06-12 ENCOUNTER — Ambulatory Visit
Admission: RE | Admit: 2014-06-12 | Discharge: 2014-06-12 | Disposition: A | Discharge status: Home / Self Care | Payer: BLUE CROSS/BLUE SHIELD | Source: Ambulatory Visit | Attending: Otolaryngology | Admitting: Otolaryngology

## 2014-06-12 ENCOUNTER — Other Ambulatory Visit: Payer: Self-pay | Admitting: Otolaryngology

## 2014-06-12 DIAGNOSIS — R059 Cough, unspecified: Secondary | ICD-10-CM

## 2014-06-12 DIAGNOSIS — R05 Cough: Secondary | ICD-10-CM

## 2014-06-16 ENCOUNTER — Telehealth (HOSPITAL_COMMUNITY): Payer: Self-pay

## 2014-06-16 NOTE — Telephone Encounter (Signed)
Encounter complete. 

## 2014-06-17 ENCOUNTER — Telehealth (HOSPITAL_COMMUNITY): Payer: Self-pay

## 2014-06-17 NOTE — Telephone Encounter (Signed)
Encounter complete. 

## 2014-06-18 ENCOUNTER — Ambulatory Visit (HOSPITAL_BASED_OUTPATIENT_CLINIC_OR_DEPARTMENT_OTHER)
Admission: RE | Admit: 2014-06-18 | Discharge: 2014-06-18 | Disposition: A | Discharge status: Home / Self Care | Payer: BLUE CROSS/BLUE SHIELD | Source: Ambulatory Visit | Attending: Cardiovascular Disease | Admitting: Cardiovascular Disease

## 2014-06-18 ENCOUNTER — Ambulatory Visit (HOSPITAL_COMMUNITY)
Admission: RE | Admit: 2014-06-18 | Discharge: 2014-06-18 | Disposition: A | Discharge status: Home / Self Care | Payer: BLUE CROSS/BLUE SHIELD | Source: Ambulatory Visit | Attending: Cardiovascular Disease | Admitting: Cardiovascular Disease

## 2014-06-18 ENCOUNTER — Ambulatory Visit: Payer: Self-pay | Admitting: Internal Medicine

## 2014-06-18 DIAGNOSIS — R0602 Shortness of breath: Secondary | ICD-10-CM

## 2014-06-18 DIAGNOSIS — R079 Chest pain, unspecified: Secondary | ICD-10-CM | POA: Diagnosis not present

## 2014-06-18 DIAGNOSIS — R55 Syncope and collapse: Secondary | ICD-10-CM | POA: Diagnosis not present

## 2014-06-18 LAB — EXERCISE TOLERANCE TEST
CHL CUP RESTING HR STRESS: 85 {beats}/min
CHL CUP STRESS STAGE 1 HR: 89 {beats}/min
CHL CUP STRESS STAGE 1 SBP: 119 mmHg
CHL CUP STRESS STAGE 1 SPEED: 0 mph
CHL CUP STRESS STAGE 2 HR: 88 {beats}/min
CHL CUP STRESS STAGE 4 DBP: 79 mmHg
CHL CUP STRESS STAGE 4 SBP: 118 mmHg
CHL CUP STRESS STAGE 4 SPEED: 1.7 mph
CHL CUP STRESS STAGE 5 DBP: 78 mmHg
CHL CUP STRESS STAGE 6 HR: 153 {beats}/min
CHL CUP STRESS STAGE 7 GRADE: 16 %
CHL CUP STRESS STAGE 7 SPEED: 4.2 mph
CHL CUP STRESS STAGE 8 GRADE: 0 %
CHL CUP STRESS STAGE 9 DBP: 72 mmHg
CHL CUP STRESS STAGE 9 HR: 103 {beats}/min
CHL CUP STRESS STAGE 9 SBP: 111 mmHg
CHL RATE OF PERCEIVED EXERTION: 30141
CSEPHR: 95 %
CSEPPHR: 166 {beats}/min
CSEPPMHR: 93 %
Estimated workload: 11.7 METS
Exercise duration (min): 10 min
MPHR: 177 {beats}/min
Stage 1 DBP: 84 mmHg
Stage 1 Grade: 0 %
Stage 2 Grade: 0 %
Stage 2 Speed: 0.9 mph
Stage 3 Grade: 0.1 %
Stage 3 HR: 88 {beats}/min
Stage 3 Speed: 1 mph
Stage 4 Grade: 10 %
Stage 4 HR: 117 {beats}/min
Stage 5 Grade: 12 %
Stage 5 HR: 134 {beats}/min
Stage 5 SBP: 126 mmHg
Stage 5 Speed: 2.5 mph
Stage 6 DBP: 113 mmHg
Stage 6 Grade: 14 %
Stage 6 SBP: 197 mmHg
Stage 6 Speed: 3.4 mph
Stage 7 HR: 166 {beats}/min
Stage 8 DBP: 101 mmHg
Stage 8 HR: 148 {beats}/min
Stage 8 SBP: 149 mmHg
Stage 8 Speed: 0 mph
Stage 9 Grade: 0 %
Stage 9 Speed: 0 mph

## 2014-06-23 ENCOUNTER — Encounter: Payer: Self-pay | Admitting: Cardiovascular Disease

## 2014-06-23 ENCOUNTER — Ambulatory Visit (INDEPENDENT_AMBULATORY_CARE_PROVIDER_SITE_OTHER): Payer: BLUE CROSS/BLUE SHIELD | Admitting: Cardiovascular Disease

## 2014-06-23 VITALS — BP 126/86 | HR 88 | Ht 65.0 in | Wt 198.0 lb

## 2014-06-23 DIAGNOSIS — R0609 Other forms of dyspnea: Secondary | ICD-10-CM

## 2014-06-23 DIAGNOSIS — R0789 Other chest pain: Secondary | ICD-10-CM | POA: Diagnosis not present

## 2014-06-23 DIAGNOSIS — R9439 Abnormal result of other cardiovascular function study: Secondary | ICD-10-CM

## 2014-06-23 NOTE — Assessment & Plan Note (Signed)
2-D echo which performed was essentially normal

## 2014-06-23 NOTE — Patient Instructions (Signed)
  We will see you back in follow up as needed.   Dr Berry has ordered: Exercise Myoview- this is a test that looks at the blood flow to your heart muscle.  It takes approximately 2 1/2 hours. Please follow instruction sheet, as given.      

## 2014-06-23 NOTE — Progress Notes (Signed)
Karen Mcdonald returns today for follow-up of her outpatient diagnostic tests. Her 2-D echo was normal however her routine GXT was borderline abnormal. She continues to have atypical chest pain. I'm going to order an exercise Myoview stress test to rule out an ischemic etiology.

## 2014-06-23 NOTE — Assessment & Plan Note (Signed)
Routine GXT was borderline positive. I suspect this was falsely positive however I'm going to order an exercise Myoview stress test to determine this.

## 2014-07-01 ENCOUNTER — Telehealth (HOSPITAL_COMMUNITY): Payer: Self-pay

## 2014-07-01 NOTE — Telephone Encounter (Signed)
Encounter complete. 

## 2014-07-03 ENCOUNTER — Ambulatory Visit (HOSPITAL_COMMUNITY)
Admission: RE | Admit: 2014-07-03 | Discharge: 2014-07-03 | Disposition: A | Discharge status: Home / Self Care | Payer: BLUE CROSS/BLUE SHIELD | Source: Ambulatory Visit | Attending: Cardiology | Admitting: Cardiology

## 2014-07-03 DIAGNOSIS — R9439 Abnormal result of other cardiovascular function study: Secondary | ICD-10-CM | POA: Insufficient documentation

## 2014-07-03 LAB — MYOCARDIAL PERFUSION IMAGING
CHL CUP NUCLEAR SDS: 4
CHL CUP NUCLEAR SSS: 4
CHL CUP RESTING HR STRESS: 84 {beats}/min
CHL CUP STRESS STAGE 1 GRADE: 0 %
CHL CUP STRESS STAGE 1 HR: 83 {beats}/min
CHL CUP STRESS STAGE 1 SPEED: 0 mph
CHL CUP STRESS STAGE 2 GRADE: 0 %
CHL CUP STRESS STAGE 3 GRADE: 0 %
CHL CUP STRESS STAGE 4 DBP: 76 mmHg
CHL CUP STRESS STAGE 5 SBP: 131 mmHg
CHL CUP STRESS STAGE 6 DBP: 91 mmHg
CHL CUP STRESS STAGE 6 HR: 151 {beats}/min
CHL CUP STRESS STAGE 6 SBP: 166 mmHg
CHL CUP STRESS STAGE 7 GRADE: 16 %
CHL CUP STRESS STAGE 7 SPEED: 4.2 mph
CHL CUP STRESS STAGE 8 GRADE: 0 %
CHL CUP STRESS STAGE 8 SPEED: 0 mph
CHL CUP STRESS STAGE 9 HR: 95 {beats}/min
CHL CUP STRESS STAGE 9 SPEED: 0 mph
CSEPHR: 93 %
CSEPPMHR: 93 %
Estimated workload: 11.7 METS
Exercise duration (min): 10 min
LV sys vol: 37 mL
LVDIAVOL: 89 mL
MPHR: 177 {beats}/min
Nuc Stress EF: 59 %
Peak HR: 166 {beats}/min
RPE: 25066
SRS: 0
Stage 2 HR: 81 {beats}/min
Stage 2 Speed: 1 mph
Stage 3 HR: 81 {beats}/min
Stage 3 Speed: 1 mph
Stage 4 Grade: 10 %
Stage 4 HR: 111 {beats}/min
Stage 4 SBP: 111 mmHg
Stage 4 Speed: 1.7 mph
Stage 5 DBP: 66 mmHg
Stage 5 Grade: 12 %
Stage 5 HR: 127 {beats}/min
Stage 5 Speed: 2.5 mph
Stage 6 Grade: 14 %
Stage 6 Speed: 3.4 mph
Stage 7 HR: 166 {beats}/min
Stage 8 DBP: 72 mmHg
Stage 8 HR: 141 {beats}/min
Stage 8 SBP: 119 mmHg
Stage 9 DBP: 65 mmHg
Stage 9 Grade: 0 %
Stage 9 SBP: 104 mmHg
TID: 1.03

## 2014-07-03 MED ORDER — TECHNETIUM TC 99M SESTAMIBI GENERIC - CARDIOLITE
31.2000 | Freq: Once | INTRAVENOUS | Status: AC | PRN
  Administered 2014-07-03: 31.2 via INTRAVENOUS

## 2014-07-03 MED ORDER — TECHNETIUM TC 99M SESTAMIBI GENERIC - CARDIOLITE
10.5000 | Freq: Once | INTRAVENOUS | Status: AC | PRN
  Administered 2014-07-03: 11 via INTRAVENOUS

## 2014-07-07 ENCOUNTER — Telehealth: Payer: Self-pay

## 2014-07-08 NOTE — Telephone Encounter (Signed)
Called patient yesterday with stress test results. Patient understood but still complained of problems. Advised patient that Dr Allyson SabalBerry was out of the office but I would speak with him.   Dr Allyson SabalBerry advised an office visit to discuss results in further detail.  Left message, today, on voicemail advising patient of appointment on 6/3 at 10 am.

## 2014-07-10 ENCOUNTER — Ambulatory Visit: Payer: BLUE CROSS/BLUE SHIELD | Admitting: Cardiovascular Disease

## 2014-08-07 ENCOUNTER — Other Ambulatory Visit: Payer: Self-pay

## 2014-08-07 DIAGNOSIS — Z1231 Encounter for screening mammogram for malignant neoplasm of breast: Secondary | ICD-10-CM

## 2014-08-19 ENCOUNTER — Ambulatory Visit: Payer: BLUE CROSS/BLUE SHIELD | Admitting: Cardiovascular Disease

## 2014-09-02 ENCOUNTER — Other Ambulatory Visit: Payer: Self-pay

## 2014-09-02 DIAGNOSIS — Z9889 Other specified postprocedural states: Secondary | ICD-10-CM

## 2014-09-02 DIAGNOSIS — Z1231 Encounter for screening mammogram for malignant neoplasm of breast: Secondary | ICD-10-CM

## 2014-09-17 ENCOUNTER — Ambulatory Visit: Payer: BLUE CROSS/BLUE SHIELD

## 2014-09-18 ENCOUNTER — Ambulatory Visit: Payer: BLUE CROSS/BLUE SHIELD

## 2014-09-22 ENCOUNTER — Ambulatory Visit
Admission: RE | Admit: 2014-09-22 | Discharge: 2014-09-22 | Disposition: A | Discharge status: Home / Self Care | Payer: BLUE CROSS/BLUE SHIELD | Source: Ambulatory Visit

## 2014-09-22 DIAGNOSIS — Z9889 Other specified postprocedural states: Secondary | ICD-10-CM

## 2014-09-22 DIAGNOSIS — Z1231 Encounter for screening mammogram for malignant neoplasm of breast: Secondary | ICD-10-CM

## 2014-11-10 ENCOUNTER — Encounter: Payer: Self-pay | Admitting: Neurology

## 2014-11-10 ENCOUNTER — Ambulatory Visit (INDEPENDENT_AMBULATORY_CARE_PROVIDER_SITE_OTHER): Payer: BLUE CROSS/BLUE SHIELD | Admitting: Neurology

## 2014-11-10 VITALS — BP 134/83 | HR 72 | Ht 65.0 in | Wt 201.8 lb

## 2014-11-10 DIAGNOSIS — R55 Syncope and collapse: Secondary | ICD-10-CM | POA: Diagnosis not present

## 2014-11-10 DIAGNOSIS — F419 Anxiety disorder, unspecified: Secondary | ICD-10-CM | POA: Diagnosis not present

## 2014-11-10 DIAGNOSIS — W19XXXA Unspecified fall, initial encounter: Secondary | ICD-10-CM | POA: Insufficient documentation

## 2014-11-10 HISTORY — DX: Syncope and collapse: R55

## 2014-11-10 HISTORY — DX: Anxiety disorder, unspecified: F41.9

## 2014-11-10 HISTORY — DX: Unspecified fall, initial encounter: W19.XXXA

## 2014-11-10 NOTE — Progress Notes (Addendum)
NEUROLOGY CLINIC NEW PATIENT NOTE  NAME: Karen Mcdonald DOB: 1970-05-11 REFERRING PHYSICIAN: Candice Camp, MD  I saw Karen Mcdonald as a new consult in the neurovascular clinic today regarding  Chief Complaint  Patient presents with  . Referral    referral from Dr.Lowe  .  HPI: Karen Mcdonald is a 44 y.o. female with no significant PMH who presents as a new patient for Body lockup episodes.   Patient stated that on 10/19/2014, patient was walking along swimming pool at home, suddenly she felt her whole body was locked up, both hands cramping, clenched and locked up. Both her feet was also locked up, and she was not able to move. She fell to the ground and remained on the ground for about one hour. After that, gradually she felt better, managed to get up and walk around, however still had significant pain in all extremities not able to touch which lasted an to the second day. She denies any loss of consciousness, seizure, nausea vomiting, or lose urine or bowel. Has never had this episode before.   She called her PCP, recommend to go to ER. However she refused due to cost. She went to urgent care saw Dr. Tyler Deis and had MRI showed no acute abnormality but nonspecific T2 white matter abnormalities but once for age. Dr. Tyler Deis considered a panic attack, and recommended Xanax twice a day which patient refused. She was not very happy during that visit. She was referred here for further evaluation. She said she had blood test showed high K at 5.4.  She has no significant medical history. She stated that for the last 6 months, she had frequent foot cramps, moving up to bilateral calves, and bilateral legs, now goes to her back and bilateral hands. She also had night sweats for the 6 months, follow-up with PCP and had many blood tests negative.   She is current smoker, half pack per day. Social drinking, denies any illicit drugs.  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 However she seems tearful on this topic. By reviewing referral documentation, it stays "patient states she is having issues with her life pattern, and he is demanding we ordered CT scan and sent her to a specialist. Patient is very rude on phone and refuses to go to primary care. Leah spoken to patient for over 20 minutes this morning and was given a very difficult time with patient, patient very threatening, making accusations... Advised her to go to ED or urgent care and be evaluated. She states her life partner refuses, she has multiple bruises". Of note, she has Lexapro on her home medication.   Past Medical History  Diagnosis Date  . Dyspnea on exertion   . Atypical chest pain   . Anxiety disorder   . Syncope   . Positive cardiac stress test     borderline positive GXT  . Vision abnormalities   . Headache    History reviewed. No pertinent past surgical history. Family History  Problem Relation Age of Onset  . AAA (abdominal aortic aneurysm) Mother   . Lung cancer Father   . Leukemia Father    Current Outpatient Prescriptions  Medication Sig Dispense Refill  . ALPRAZolam (XANAX) 0.25 MG tablet Take 0.25 mg by mouth every 8 (eight) hours as needed.  0  . Cholecalciferol (VITAMIN D) 2000 UNITS CAPS Take 2,000 Units by mouth daily.    Marland Kitchen  escitalopram (LEXAPRO) 10 MG tablet Take 10 mg by mouth daily.    . meclizine (ANTIVERT) 25 MG tablet Take 25 mg by mouth 3 (three) times daily as needed.  1  . Norethindrone-Ethinyl Estradiol-Fe Biphas (LO LOESTRIN FE) 1 MG-10 MCG / 10 MCG tablet Take 1 tablet by mouth daily.    Marland Kitchen omeprazole (PRILOSEC) 20 MG capsule Take 20 mg by mouth 2 (two) times daily before a meal.   6   No current facility-administered medications for this visit.   Allergies  Allergen Reactions  . Erythromycin Nausea And Vomiting  . Latex Swelling   Social History   Social History    . Marital Status: Single    Spouse Name: N/A  . Number of Children: N/A  . Years of Education: N/A   Occupational History  . Not on file.   Social History Main Topics  . Smoking status: Current Every Day Smoker -- 0.25 packs/day  . Smokeless tobacco: Not on file  . Alcohol Use: Yes     Comment: social drinker  . Drug Use: No  . Sexual Activity: Not on file   Other Topics Concern  . Not on file   Social History Narrative    Review of Systems Full 14 system review of systems performed and notable only for those listed, all others are neg:  Constitutional:  Weight gain Cardiovascular:  Ear/Nose/Throat:   Skin:  Eyes:  Blurry vision Respiratory:   Gastroitestinal:   Genitourinary:  Hematology/Lymphatic:  Lymph nodes swollen Endocrine:  Musculoskeletal:  Cramps Allergy/Immunology:   Neurological:  Memory loss, headache, numbness, dizziness, seizures, passing out, tremor Psychiatric: Decreased energy Sleep:   Physical Exam  Filed Vitals:   11/10/14 1656  BP: 134/83  Pulse: 72   Orthostatic vitals Lying 129/73 - 68, sitting 134/83 - 72, standing 126/80 - 83  General - Well nourished, well developed, mildly anxious.  Ophthalmologic - Sharp disc margins OU.  Cardiovascular - Regular rate and rhythm with no murmur. Carotid pulses were 2+ without bruits .   Neck - supple, no nuchal rigidity .  Mental Status -  Level of arousal and orientation to time, place, and person were intact. Language including expression, naming, repetition, comprehension, reading, and writing was assessed and found intact. Attention span and concentration were normal. Recent and remote memory were intact. Fund of Knowledge was assessed and was intact.  Cranial Nerves II - XII - II - Visual field intact OU. III, IV, VI - Extraocular movements intact. V - Facial sensation intact bilaterally. VII - Facial movement intact bilaterally. VIII - Hearing & vestibular intact bilaterally. X  - Palate elevates symmetrically. XI - Chin turning & shoulder shrug intact bilaterally. XII - Tongue protrusion intact.  Motor Strength - The patient's strength was normal in all extremities and pronator drift was absent.  Bulk was normal and fasciculations were absent.   Motor Tone - Muscle tone was assessed at the neck and appendages and was normal.  Reflexes - The patient's reflexes were normal in all extremities and she had no pathological reflexes.  Sensory - Light touch, temperature/pinprick, vibration and proprioception, and Romberg testing were assessed and were normal.    Coordination - The patient had normal movements in the hands and feet with no ataxia or dysmetria.  Tremor was absent.  Gait and Station - The patient's transfers, posture, gait, station, and turns were observed as normal.   Imaging  MRI 10/26/14 - IMPRESSION:  1. No acute intracranial abnormality or  mass. No etiology ofseizures identified.  2. Small foci of cerebral white matter T2 signal abnormality whichare advanced for age and nonspecific. Considerations include chronic small vessel ischemia, sequelae of trauma, hypercoagulable state,vasculitis, migraines, prior infection and demyelination.  Lab Review Component     Latest Ref Rng 05/21/2014  Total Bilirubin     0.2 - 1.2 mg/dL 0.6  Bilirubin, Direct     0.0 - 0.3 mg/dL 0.1  Indirect Bilirubin     0.2 - 1.2 mg/dL 0.5  Alkaline Phosphatase     39 - 117 U/L 75  AST     0 - 37 U/L 23  ALT     0 - 35 U/L 31  Total Protein     6.0 - 8.3 g/dL 6.8  Albumin     3.5 - 5.2 g/dL 4.2  Cholesterol     0 - 200 mg/dL 161  Triglycerides     <150 mg/dL 096 (H)  HDL Cholesterol     >=46 mg/dL 40 (L)  Total CHOL/HDL Ratio      4.5  VLDL     0 - 40 mg/dL 37  LDL (calc)     0 - 99 mg/dL 045 (H)     Assessment and Plan:   In summary, Karen Mcdonald is a 44 y.o. female with no significant PMH but on lexapro presents with body lock up  episode on 10/18/14 lasting one hour. She stated that her arm and leg muscle cramping, clenched and locked up, as well as painful and twitching. She denies any family issue or stress or using drugs. However, on her communication with PCP office, she seemed to have issue with boyfriend and very rude, threatening and difficulty to deal with. Not sure if she was on drugs or have issues with boyfriend. She was felt to have panic attack. MRI negative except T2 WM abnormal signals. However, her BMP showed K+ 5.4. She was given Xanax bid PRN. DDx including conversion disorder, periodic paralysis, female kennedy disease, seizure and MS. However, her stiffness and cramping not supporting periodic paralysis, and her lack of family hx did not support kennedy's disease, and with both arm leg involvement and no LOC does not support seizure. Will check EEG and refer pt to Neuromuscular specialist Dr. Lucia Gaskins.  - will do EEG to rule out seizure like activity - review MRI to decide whether LP is necessary - arrange for an appointment with neuromuscular specialist Dr. Lucia Gaskins to consider EMG/NCS and to rule out neuromuscular diseases  - cope with stress and anxiety - if it happens again, please keep yourself safe first and then either call 911 or go to nearest ER.   Thank you very much for the opportunity to participate in the care of this patient.  Please do not hesitate to call if any questions or concerns arise.  Orders Placed This Encounter  Procedures  . EEG adult    Standing Status: Future     Number of Occurrences:      Standing Expiration Date: 11/10/2015    Meds ordered this encounter  Medications  . ALPRAZolam (XANAX) 0.25 MG tablet    Sig: Take 0.25 mg by mouth every 8 (eight) hours as needed.    Refill:  0  . meclizine (ANTIVERT) 25 MG tablet    Sig: Take 25 mg by mouth 3 (three) times daily as needed.    Refill:  1    Patient Instructions  - will do EEG to rule out  seizure like activity - review  the MRI and to decide whether LP is necessary - arrange for an appointment with neuromuscular specialist Dr. Lucia Gaskins to consider nerve conduction study and to rule out neuromuscular disease  - cope with stress and anxiety - if it happens again, please keep yourself safe first and then either call 911 or go to nearest ER.     Marvel Plan, MD PhD [facility] [address] [place], [place] [address] [phone]

## 2014-11-10 NOTE — Addendum Note (Signed)
Addended by: Marvel Plan on: 11/10/2014 10:50 PM   Modules accepted: Orders

## 2014-11-10 NOTE — Patient Instructions (Signed)
-   will do EEG to rule out seizure like activity - review the MRI and to decide whether LP is necessary - arrange for an appointment with neuromuscular specialist Dr. Lucia Gaskins to consider nerve conduction study and to rule out neuromuscular disease  - cope with stress and anxiety - if it happens again, please keep yourself safe first and then either call 911 or go to nearest ER.

## 2014-11-11 ENCOUNTER — Telehealth: Payer: Self-pay | Admitting: *Deleted

## 2014-11-11 NOTE — Telephone Encounter (Signed)
Thanks much, Emma.  Marvel Plan, MD PhD Stroke Neurology 11/11/2014 2:58 PM

## 2014-11-11 NOTE — Telephone Encounter (Signed)
Called and spoke with pt to set up NP appt with Dr. Lucia Gaskins per Dr. Roda Shutters request. Made appt for 10/7 at 930am for check in at 915. Also made appt for EEG on 11/12/14 at 1030am for check in at 10am. Pt verbalized understanding.

## 2014-11-12 ENCOUNTER — Ambulatory Visit (INDEPENDENT_AMBULATORY_CARE_PROVIDER_SITE_OTHER): Payer: BLUE CROSS/BLUE SHIELD | Admitting: Neurology

## 2014-11-12 DIAGNOSIS — R55 Syncope and collapse: Secondary | ICD-10-CM

## 2014-11-12 DIAGNOSIS — F419 Anxiety disorder, unspecified: Secondary | ICD-10-CM

## 2014-11-12 DIAGNOSIS — W19XXXA Unspecified fall, initial encounter: Secondary | ICD-10-CM

## 2014-11-12 NOTE — Procedures (Signed)
    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 This is unassociated with loss of consciousness. The patient is being evaluated for these events.  This is a routine EEG. No skull defects are noted. Medications include alprazolam, vitamin D, Lexapro, meclizine, birth control pills, and Prilosec.  EEG classification: Normal awake  Description of the recording: The background rhythms of this recording consists of a fairly well modulated medium amplitude alpha rhythm of 10 Hz that is reactive to eye opening and closure. As the record progresses, the patient appears to remain in the waking state throughout the recording. Photic stimulation was performed, resulting in a bilateral and symmetric photic driving response. Hyperventilation was also performed, resulting in a minimal buildup of the background rhythm activities without significant slowing seen. At no time during the recording does there appear to be evidence of spike or spike wave discharges or evidence of focal slowing. EKG monitor shows no evidence of cardiac rhythm abnormalities with a heart rate of 84.  Impression: This is a normal EEG recording in the waking state. No evidence of ictal or interictal discharges are seen.

## 2014-11-13 ENCOUNTER — Encounter: Payer: Self-pay | Admitting: Neurology

## 2014-11-13 ENCOUNTER — Ambulatory Visit (INDEPENDENT_AMBULATORY_CARE_PROVIDER_SITE_OTHER): Payer: BLUE CROSS/BLUE SHIELD | Admitting: Neurology

## 2014-11-13 VITALS — BP 143/70 | HR 114 | Ht 65.0 in | Wt 199.0 lb

## 2014-11-13 DIAGNOSIS — R252 Cramp and spasm: Secondary | ICD-10-CM | POA: Diagnosis not present

## 2014-11-13 DIAGNOSIS — R5383 Other fatigue: Secondary | ICD-10-CM | POA: Insufficient documentation

## 2014-11-13 DIAGNOSIS — R5382 Chronic fatigue, unspecified: Secondary | ICD-10-CM

## 2014-11-13 DIAGNOSIS — R202 Paresthesia of skin: Secondary | ICD-10-CM | POA: Diagnosis not present

## 2014-11-13 DIAGNOSIS — R251 Tremor, unspecified: Secondary | ICD-10-CM | POA: Diagnosis not present

## 2014-11-13 HISTORY — DX: Paresthesia of skin: R20.2

## 2014-11-13 HISTORY — DX: Tremor, unspecified: R25.1

## 2014-11-13 HISTORY — DX: Other fatigue: R53.83

## 2014-11-13 HISTORY — DX: Cramp and spasm: R25.2

## 2014-11-13 NOTE — Patient Instructions (Signed)
Overall you are doing fairly well but I do want to suggest a few things today:   Remember to drink plenty of fluid, eat healthy meals and do not skip any meals. Try to eat protein with a every meal and eat a healthy snack such as fruit or nuts in between meals. Try to keep a regular sleep-wake schedule and try to exercise daily, particularly in the form of walking, 20-30 minutes a day, if you can.   As far as diagnostic testing: labs, emg/ncs  I would like to see you back for emg/ncs, sooner if we need to. Please call us with any interim questions, concerns, problems, updates or refill requests.   Our phone number is [phone]. We also have an after hours call service for urgent matters and there is a physician on-call for urgent questions. For any emergencies you know to call 911 or go to the nearest emergency room

## 2014-11-13 NOTE — Progress Notes (Signed)
[facility]    Provider:  Dr Lucia Mcdonald Referring Provider: Candice Camp, MD Primary Care Physician:  Karen Daniels, MD  CC:  Body lockup episodes  HPI:  Karen Mcdonald is a 44 y.o. female here as a referral from Dr. Rana Mcdonald for body lockup episodes. It has been multiple episodes. She "locks up" in her legs. She has bad foot cramps for years. For the last year symptoms have changes, she gets cramps in her ankles and he legs get super rigid but not like a charlie horse. If she moves around, it will last 5 minutes. It can happen anywhere in the body. She gets it in her back, she gets the symptoms in her arms, her hands, mostly the extremities and the low back and side. She had the one very bad episode where her whole body from the neck down couldn't move, her arms curled around, her legs wrapped around themselves, she fell over into a fetal position which lasted for an hour before she could move.No wraning, acute and just happened. EEG was normal. She told her primary care and they told to go to the ED, she declined and went to urgent care and she went to see Dr. Tyler Mcdonald who is a neurologist in [place] and dxed with carpopedal seizure told her to take 2 xanax a day. The symptoms happen 2-3x a week. They last briefly but up to an hour at it worst. No known triggers. Happens at night as well. She can be at rest, walking, no specific pattern. Xanax didn't help with the symptoms. Left leg is worst but all limbs are affected. She also gets dizzy periodically. She gets blurry vision periodically. She has been having headaches since the episode. She has tremors now, her voice will crack now. No excessive caffeine. She has numbness and tingling int he fingers. She has stiff neck but no shooting pains into the arms. She has been diagnosed with OSA several years ago 3-4 years and never followed up.  No weakness. No Fhx of neuromuscular disorders.  Reviewed notes, labs and imaging from  outside physicians, which showed:  Previous notes from Karen Mcdonald;  Dr Roda Mcdonald 11/10/2014: Karen Mcdonald is a 44 y.o. female with no significant PMH who presents as a new patient for Body lockup episodes.   Patient stated that on 10/19/2014, patient was walking along swimming pool at home, suddenly she felt her whole body was locked up, both hands cramping, clenched and locked up. Both her feet was also locked up, and she was not able to move. She fell to the ground and remained on the ground for about one hour. After that, gradually she felt better, managed to get up and walk around, however still had significant pain in all extremities not able to touch which lasted an to the second day. She denies any loss of consciousness, seizure, nausea vomiting, or lose urine or bowel. Has never had this episode before.   She called her PCP, recommend to go to ER. However she refused due to cost. She went to urgent care saw Dr. Tyler Mcdonald and had MRI showed no acute abnormality but nonspecific T2 white matter abnormalities but once for age. Dr. Tyler Mcdonald considered a panic attack, and recommended Xanax twice a day which patient refused. She was not very happy during that visit. She was referred here for further evaluation. She said she had blood test showed high K at 5.4.  She has no significant medical history. She stated that for  the last 6 months, she had frequent foot cramps, moving up to bilateral calves, and bilateral legs, now goes to her back and bilateral hands. She also had night sweats for the 6 months, follow-up with PCP and had many blood tests negative.   She is current smoker, half pack per day. Social drinking, denies any illicit drugs.  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 However she seems tearful on this topic. By reviewing referral documentation, it stays "patient states she is having issues with her life  pattern, and he is demanding we ordered CT scan and sent her to a specialist. Patient is very rude on phone and refuses to go to primary care. Karen Mcdonald spoken to patient for over 20 minutes this morning and was given a very difficult time with patient, patient very threatening, making accusations... Advised her to go to ED or urgent care and be evaluated. She states her life partner refuses, she has multiple bruises". Of note, she has Lexapro on her home medication.   Review of Systems: Patient complains of symptoms per HPI as well as the following symptoms: No CP, no SOB. Pertinent negatives per HPI. All others negative.   Social History   Social History  . Marital Status: Single    Spouse Name: N/A  . Number of Children: N/A  . Years of Education: N/A   Occupational History  . Not on file.   Social History Main Topics  . Smoking status: Current Every Day Smoker -- 0.25 packs/day  . Smokeless tobacco: Not on file  . Alcohol Use: Yes     Comment: social drinker  . Drug Use: No  . Sexual Activity: Not on file   Other Topics Concern  . Not on file   Social History Narrative    Family History  Problem Relation Age of Onset  . AAA (abdominal aortic aneurysm) Mother   . Lung cancer Father   . Leukemia Father     Past Medical History  Diagnosis Date  . Dyspnea on exertion   . Atypical chest pain   . Anxiety disorder   . Syncope   . Positive cardiac stress test     borderline positive GXT  . Vision abnormalities   . Headache     No past surgical history on file.  Current Outpatient Prescriptions  Medication Sig Dispense Refill  . ALPRAZolam (XANAX) 0.25 MG tablet Take 0.25 mg by mouth every 8 (eight) hours as needed.  0  . Cholecalciferol (VITAMIN D) 2000 UNITS CAPS Take 2,000 Units by mouth daily.    Marland Kitchen escitalopram (LEXAPRO) 10 MG tablet Take 10 mg by mouth daily.    . meclizine (ANTIVERT) 25 MG tablet Take 25 mg by mouth 3 (three) times daily as needed.  1  .  Norethindrone-Ethinyl Estradiol-Fe Biphas (LO LOESTRIN FE) 1 MG-10 MCG / 10 MCG tablet Take 1 tablet by mouth daily.    Marland Kitchen omeprazole (PRILOSEC) 20 MG capsule Take 20 mg by mouth 2 (two) times daily before a meal.   6   No current facility-administered medications for this visit.    Allergies as of 11/13/2014 - Review Complete 11/13/2014  Allergen Reaction Noted  . Erythromycin Nausea And Vomiting 12/12/2011  . Latex Swelling 12/12/2011    Vitals: BP 143/70 mmHg  Pulse 114  Ht  (1.651 m)  Wt 199 lb (90.266 kg)  BMI 33.12 kg/m2 Last Weight:  Wt Readings from Last 1 Encounters:  11/13/14 199 lb (90.266 kg)   Last Height:   Ht Readings from Last 1 Encounters:  11/13/14  (1.651 m)    Physical exam: Exam: Gen: Anxious, conversant, well nourised, obese, well groomed                     CV: RRR, no MRG. No Carotid Bruits. No peripheral edema, warm, nontender Eyes: Conjunctivae clear without exudates or hemorrhage  Neuro: Detailed Neurologic Exam  Speech:    Speech is normal; fluent and spontaneous with normal comprehension.  Cognition:    The patient is oriented to person, place, and time;     recent and remote memory intact;     language fluent;     normal attention, concentration,     fund of knowledge Cranial Nerves:    The pupils are equal, round, and reactive to light. The fundi are normal and spontaneous venous pulsations are present. Visual fields are full to finger confrontation. Extraocular movements are intact. Trigeminal sensation is intact and the muscles of mastication are normal. The face is symmetric. The palate elevates in the midline. Hearing intact. Voice is normal. Shoulder shrug is normal. The tongue has normal motion without fasciculations.   Coordination:    Normal finger to nose and heel to shin. Normal rapid alternating movements.   Gait:    Heel-toe and tandem gait are normal.   Motor Observation:    No asymmetry, no atrophy, and no  involuntary movements noted. Tone:    Normal muscle tone.    Posture:    Posture is normal. normal erect    Strength:    Strength is V/V in the upper and lower limbs.      Sensation: intact to LT     Reflex Exam:  DTR's:    Deep tendon reflexes in the upper and lower extremities are normal bilaterally.   Toes:    The toes are downgoing bilaterally.   Clonus:    Clonus is absent.      Assessment/Plan: Karen Mcdonald is a 44 y.o. female with no significant PMH but on lexapro presents with body lock up episode on 10/18/14 lasting one hour. She stated that her arm and leg muscle cramping, clenched and locked up, as well as painful and twitching. She also has daily cramping all over her body. She denies stress or anxiety but appears very anxious in th office today. Neurology exam is normal. MRI of the brain was unremarkable per Dr. Warren Danes notes.  Per Dr. Warren Danes notes,  She was felt to have panic attack. MRI negative except T2 WM abnormal signals. However, her BMP showed K+ 5.4. She was given Xanax bid PRN.   Agree with EEG to rule out seizure like activity Need images from MRI of the brain to decide whether LP is necessary, will request Will order EMG/NCS and labwork to rule out neuromuscular diseases  - cope with stress and anxiety - if it happens again, please keep yourself safe first and then either call 911 or go to nearest ER.   Naomie Dean, MD  [facility] [address] [place], [place] [address]  Phone [phone] Fax [phone]  A total of 30 minutes was spent face-to-face with this patient. Over half this time was spent on counseling patient on the muscle cramps diagnosis and different diagnostic and therapeutic options available.

## 2014-11-16 ENCOUNTER — Telehealth: Payer: Self-pay | Admitting: *Deleted

## 2014-11-16 NOTE — Telephone Encounter (Signed)
Called and spoke to pt about normal EEG results per Dr. Roda Shutters. Showed no seizure activity. Pt verbalized understanding.

## 2014-11-16 NOTE — Telephone Encounter (Signed)
-----   Message from Marvel Plan, MD sent at 11/13/2014  1:59 PM EDT ----- Hi, Katrina:  Please let the pt know that her EEG done in our office was normal EEG. No seizure activities. Please continue to follow up with Dr. Lucia Gaskins for other work up. Thanks.  Marvel Plan, MD PhD Stroke Neurology 11/13/2014 1:59 PM

## 2014-11-17 ENCOUNTER — Telehealth: Payer: Self-pay | Admitting: *Deleted

## 2014-11-17 LAB — COMPREHENSIVE METABOLIC PANEL
ALBUMIN: 4.4 g/dL (ref 3.5–5.5)
ALT: 43 IU/L — AB (ref 0–32)
AST: 29 IU/L (ref 0–40)
Albumin/Globulin Ratio: 1.8 (ref 1.1–2.5)
Alkaline Phosphatase: 93 IU/L (ref 39–117)
BILIRUBIN TOTAL: 0.4 mg/dL (ref 0.0–1.2)
BUN / CREAT RATIO: 16 (ref 9–23)
BUN: 12 mg/dL (ref 6–24)
CHLORIDE: 101 mmol/L (ref 97–108)
CO2: 21 mmol/L (ref 18–29)
Calcium: 9.7 mg/dL (ref 8.7–10.2)
Creatinine, Ser: 0.76 mg/dL (ref 0.57–1.00)
GFR calc Af Amer: 111 mL/min/{1.73_m2} (ref >59)
GFR calc non Af Amer: 96 mL/min/{1.73_m2} (ref >59)
Globulin, Total: 2.4 g/dL (ref 1.5–4.5)
Glucose: 92 mg/dL (ref 65–99)
Potassium: 4.7 mmol/L (ref 3.5–5.2)
SODIUM: 140 mmol/L (ref 134–144)
Total Protein: 6.8 g/dL (ref 6.0–8.5)

## 2014-11-17 LAB — SEDIMENTATION RATE: Sed Rate: 23 mm/hr (ref 0–32)

## 2014-11-17 LAB — HEAVY METALS, BLOOD
Arsenic: 7 ug/L (ref 2–23)
Lead, Blood: NOT DETECTED ug/dL (ref 0–19)
Mercury: 1.4 ug/L (ref 0.0–14.9)

## 2014-11-17 LAB — CK: CK TOTAL: 142 U/L (ref 24–173)

## 2014-11-17 LAB — COPPER, SERUM: COPPER: 200 ug/dL — AB (ref 72–166)

## 2014-11-17 LAB — RPR: RPR Ser Ql: NONREACTIVE

## 2014-11-17 LAB — TSH: TSH: 1.47 u[IU]/mL (ref 0.450–4.500)

## 2014-11-17 LAB — B12 AND FOLATE PANEL
Folate: <2 ng/mL — ABNORMAL LOW (ref >3.0)
Vitamin B-12: 240 pg/mL (ref 211–946)

## 2014-11-17 LAB — CERULOPLASMIN: CERULOPLASMIN: 57.8 mg/dL — AB (ref 19.0–39.0)

## 2014-11-17 LAB — MAGNESIUM: Magnesium: 2 mg/dL (ref 1.6–2.3)

## 2014-11-17 LAB — HIV ANTIBODY (ROUTINE TESTING W REFLEX): HIV SCREEN 4TH GENERATION: NONREACTIVE

## 2014-11-17 LAB — HEPATITIS C ANTIBODY: Hep C Virus Ab: <0.1 s/co ratio (ref 0.0–0.9)

## 2014-11-17 NOTE — Telephone Encounter (Signed)
-----   Message from Anson Fret, MD sent at 11/16/2014  5:45 PM EDT ----- B12 is technically normal but on the very low side. Folate is low. She should supplement with daily B12 and folate. Folate  daily and B12 1000 to 2000 mcg/day. thanks

## 2014-11-17 NOTE — Telephone Encounter (Signed)
I have spoken with Marcelino Duster this morning, and per Dr. Daisy Blossom, have advised that her folate level is low; she should take an otc folate supplement of  daily.  Have also advised that one of her liver enzymes is mildly elevated at 43--that this same enzyme was normal 6 mos. ago, so it may be nothing; she should have her pcp recheck in the near future.  Have advised that Dr. Daisy Blossom will discuss in greater detail at emg appt.  Marcelino Duster verbalized understanding of same/fim

## 2014-11-17 NOTE — Telephone Encounter (Signed)
-----   Message from Anson Fret, MD sent at 11/17/2014  7:17 AM EDT ----- Karen Mcdonald, patient's folate is very low. She should take  folate daily. Also her ALT, a liver enzyme, is just mildly elevated at 43(upper limit normal 32). It was normal 6 months ago so may just be a variation, she should have it checked again at the pcp in the near future.  Please let her know, I will review all labs wehen she comes in for emg/ncs

## 2014-11-17 NOTE — Telephone Encounter (Signed)
I have spoken with Karen Mcdonald this morning, and per Dr. Daisy Blossom, have advised that vit. b12 level was low end of normal; she should take an otc vit. b12 supplement of 1-2,018mcg daily.  Have also advised that her folate level was low; she should take an otc folate supplement of  daily.  She verbalized understanding of same/fim

## 2014-12-02 ENCOUNTER — Ambulatory Visit (INDEPENDENT_AMBULATORY_CARE_PROVIDER_SITE_OTHER): Payer: Self-pay | Admitting: Neurology

## 2014-12-02 ENCOUNTER — Ambulatory Visit (INDEPENDENT_AMBULATORY_CARE_PROVIDER_SITE_OTHER): Payer: BLUE CROSS/BLUE SHIELD | Admitting: Neurology

## 2014-12-02 DIAGNOSIS — R202 Paresthesia of skin: Secondary | ICD-10-CM | POA: Diagnosis not present

## 2014-12-02 DIAGNOSIS — R252 Cramp and spasm: Secondary | ICD-10-CM | POA: Diagnosis not present

## 2014-12-02 DIAGNOSIS — Z0289 Encounter for other administrative examinations: Secondary | ICD-10-CM

## 2014-12-02 NOTE — Progress Notes (Signed)
See procedure note.

## 2014-12-02 NOTE — Progress Notes (Signed)
  [facility]    Provider:  Dr Lucia GaskinsAhern Referring Provider: Candice CampLowe, David, MD Primary Care Physician:  Turner DanielsLOWE,DAVID C, MD  CC: Body lockup episodes  HPI: Karen Mcdonald is a 44 y.o. female here as a referral from Dr. Rana SnareLowe for body lockup episodes. It has been multiple episodes. She "locks up" in her legs. She has bad foot cramps for years. For the last year symptoms have changes, she gets cramps in her ankles and he legs get super rigid but not like a charlie horse. If she moves around, it will last 5 minutes. It can happen anywhere in the body. She gets it in her back, she gets the symptoms in her arms, her hands, mostly the extremities and the low back and side. She had the one very bad episode where her whole body from the neck down couldn't move, her arms curled around, her legs wrapped around themselves, she fell over into a fetal position which lasted for an hour before she could move.No wraning, acute and just happened. EEG was normal. She told her primary care and they told to go to the ED, she declined and went to urgent care and she went to see Dr. Tyler DeisWheeler who is a neurologist in [place] and dxed with carpopedal seizure told her to take 2 xanax a day. The symptoms happen 2-3x a week. They last briefly but up to an hour at it worst. No known triggers. Happens at night as well. She can be at rest, walking, no specific pattern. Xanax didn't help with the symptoms. Left leg is worst but all limbs are affected. She also gets dizzy periodically. She gets blurry vision periodically. She has been having headaches since the episode. She has tremors now, her voice will crack now. No excessive caffeine. She has numbness and tingling int he fingers. She has stiff neck but no shooting pains into the arms. She has been diagnosed with OSA several years ago 3-4 years and never followed up. No weakness. No Fhx of neuromuscular disorders.  Summary  Nerve conduction studies were  performed on the left upper and left lower extremities:  The left Median motor nerve showed normal conductions with normal F Wave latency The left Ulnar motor nerve showed normal conductions with normal F Wave latency The left Peroneal motor nerve showed normal conductions with normal F Wave latency The left Tibial motor nerve showed normal conductions with normal F Wave latency The left second-digit Median sensory nerve conduction was within normal limits The left fifth-digit Ulnar sensory nerve conduction was within normal limits The left superficial peroneal sensory nerve conduction was within normal limits The left Sural sensory nerve conduction was within normal limits Bilateral H Reflexes showed normal latencies  EMG Needle study was performed on selected left upper and left lower extremity muscles:   The Deltoid, Triceps, Pronator Teres, Opponens Pollicis, First Dorsal interosseous, Vastus Medialis, Anterior Tibialis, Medial Gastrocnemius, Extensor Hallucis Longus and Abductor Hallucis muscles were within normal limits.  Conclusion: This is a normal study. No electrophysiologic evidence for ulnar or median neuropathy, peripheral polyneuropathy, muscle disorder or radiculopathy.  Assessment/Plan:    Naomie DeanAntonia Ahern, MD  [facility] [address] [place], [place] [id number]  Phone [phone] Fax [phone]

## 2015-01-12 ENCOUNTER — Encounter: Payer: Self-pay | Admitting: Neurology

## 2015-01-12 ENCOUNTER — Ambulatory Visit (INDEPENDENT_AMBULATORY_CARE_PROVIDER_SITE_OTHER): Payer: BLUE CROSS/BLUE SHIELD | Admitting: Neurology

## 2015-01-12 VITALS — BP 137/90 | HR 101 | Ht 65.0 in | Wt 202.0 lb

## 2015-01-12 DIAGNOSIS — R5382 Chronic fatigue, unspecified: Secondary | ICD-10-CM | POA: Diagnosis not present

## 2015-01-12 DIAGNOSIS — F411 Generalized anxiety disorder: Secondary | ICD-10-CM | POA: Diagnosis not present

## 2015-01-12 DIAGNOSIS — R202 Paresthesia of skin: Secondary | ICD-10-CM | POA: Diagnosis not present

## 2015-01-12 DIAGNOSIS — E538 Deficiency of other specified B group vitamins: Secondary | ICD-10-CM | POA: Diagnosis not present

## 2015-01-12 DIAGNOSIS — R252 Cramp and spasm: Secondary | ICD-10-CM | POA: Diagnosis not present

## 2015-01-12 MED ORDER — PREGABALIN 75 MG PO CAPS: 75.0000 mg | ORAL_CAPSULE | Freq: Two times a day (BID) | ORAL | Status: DC

## 2015-01-12 NOTE — Patient Instructions (Signed)
Remember to drink plenty of fluid, eat healthy meals and do not skip any meals. Try to eat protein with a every meal and eat a healthy snack such as fruit or nuts in between meals. Try to keep a regular sleep-wake schedule and try to exercise daily, particularly in the form of walking, 20-30 minutes a day, if you can.   As far as your medications are concerned, I would like to suggest: start Lyrica 75mg  twice daily  As far as diagnostic testing: labs   Our phone number is [phone]. We also have an after hours call service for urgent matters and there is a physician on-call for urgent questions. For any emergencies you know to call 911 or go to the nearest emergency room

## 2015-01-12 NOTE — Progress Notes (Signed)
[facility]    Provider:  Dr Lucia Gaskins Referring Provider: Candice Camp, MD Primary Care Physician:  Turner Daniels, MD  CC: Body lockup episodes  Interval history 01/12/2015: Karen Mcdonald is a 44 y.o. female here as a referral from Dr. Rana Snare for body lockup episodes. It has been multiple episodes. She "locks up" in her legs, and back and arms and face. All over cramps. She has bad foot cramps for years. She had an episode where her whole body locked up and she could not move. Work up to date has been negative including: EEG was normal, EMG/NCS was normal. Extensive lab testing was unremarkable:B12, cbc, tsh, b12, rpr, hiv, cmp, magnesium, sed rate, hep c, heavy metals, ck, ceruloplasmin and copper, lipid. B12 and folate were low-normal so she was started on a supplement.  Yesterday she had an episode where she has severe cramping in the feet, muscle clenching. She has cramping in the arms, the back, the legs. It is happening randomly. She won't have it for a week then she will have it every day. Lasts a few minutes then resolves. She had an MRI with abnormal white matter changes but not likely MS. Will repeat the MRI of the brain in 6 months. Her B12 was low normal, she has been taking B12 supplements and will repeat labs. Will start Lyrica  twice daily. MRIof the brain showed nonspecific white matter changes, unlikely to be demyelinating disease.   HPI: Karen Mcdonald is a 44 y.o. female here as a referral from Dr. Rana Snare for body lockup episodes. It has been multiple episodes. She "locks up" in her legs. She has bad foot cramps for years. For the last year symptoms have changes, she gets cramps in her ankles and he legs get super rigid but not like a charlie horse. If she moves around, it will last 5 minutes. It can happen anywhere in the body. She gets it in her back, she gets the symptoms in her arms, her hands, mostly the extremities and the low back and  side. She had the one very bad episode where her whole body from the neck down couldn't move, her arms curled around, her legs wrapped around themselves, she fell over into a fetal position which lasted for an hour before she could move.No wraning, acute and just happened. EEG was normal. She told her primary care and they told to go to the ED, she declined and went to urgent care and she went to see Dr. Tyler Deis who is a neurologist in [place] and dxed with carpopedal seizure told her to take 2 xanax a day. The symptoms happen 2-3x a week. They last briefly but up to an hour at it worst. No known triggers. Happens at night as well. She can be at rest, walking, no specific pattern. Xanax didn't help with the symptoms. Left leg is worst but all limbs are affected. She also gets dizzy periodically. She gets blurry vision periodically. She has been having headaches since the episode. She has tremors now, her voice will crack now. No excessive caffeine. She has numbness and tingling int he fingers. She has stiff neck but no shooting pains into the arms. She has been diagnosed with OSA several years ago 3-4 years and never followed up. No weakness. No Fhx of neuromuscular disorders.  Reviewed notes, labs and imaging from outside physicians, which showed:  Previous notes from Dr. Roda Shutters;  Dr Roda Shutters 11/10/2014: Karen Mcdonald is a 44 y.o. female  with no significant PMH who presents as a new patient for Body lockup episodes.   Patient stated that on 10/19/2014, patient was walking along swimming pool at home, suddenly she felt her whole body was locked up, both hands cramping, clenched and locked up. Both her feet was also locked up, and she was not able to move. She fell to the ground and remained on the ground for about one hour. After that, gradually she felt better, managed to get up and walk around, however still had significant pain in all extremities not able to touch which lasted an to the second day.  She denies any loss of consciousness, seizure, nausea vomiting, or lose urine or bowel. Has never had this episode before.   She called her PCP, recommend to go to ER. However she refused due to cost. She went to urgent care saw Dr. Tyler DeisWheeler and had MRI showed no acute abnormality but nonspecific T2 white matter abnormalities but once for age. Dr. Tyler DeisWheeler considered a panic attack, and recommended Xanax twice a day which patient refused. She was not very happy during that visit. She was referred here for further evaluation. She said she had blood test showed high K at 5.4.  She has no significant medical history. She stated that for the last 6 months, she had frequent foot cramps, moving up to bilateral calves, and bilateral legs, now goes to her back and bilateral hands. She also had night sweats for the 6 months, follow-up with PCP and had many blood tests negative.   She is current smoker, half pack per day. Social drinking, denies any illicit drugs.  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 However she seems tearful on this topic. By reviewing referral documentation, it stays "patient states she is having issues with her life pattern, and he is demanding we ordered CT scan and sent her to a specialist. Patient is very rude on phone and refuses to go to primary care. Leah spoken to patient for over 20 minutes this morning and was given a very difficult time with patient, patient very threatening, making accusations... Advised her to go to ED or urgent care and be evaluated. She states her life partner refuses, she has multiple bruises". Of note, she has Lexapro on her home medication.   Review of Systems: Patient complains of symptoms per HPI as well as the following symptoms: No CP, no SOB. Pertinent negatives per HPI. All others negative.    Social History   Social History  . Marital Status: Single    Spouse Name:  N/A  . Number of Children: N/A  . Years of Education: N/A   Occupational History  . Not on file.   Social History Main Topics  . Smoking status: Current Every Day Smoker -- 0.25 packs/day  . Smokeless tobacco: Not on file  . Alcohol Use: Yes     Comment: social drinker  . Drug Use: No  . Sexual Activity: Not on file   Other Topics Concern  . Not on file   Social History Narrative    Family History  Problem Relation Age of Onset  . AAA (abdominal aortic aneurysm) Mother   . Lung cancer Father   . Leukemia Father   . Neuropathy Neg Hx   . Multiple sclerosis Neg Hx     Past Medical History  Diagnosis Date  . Dyspnea on exertion   . Atypical  chest pain   . Anxiety disorder   . Syncope   . Positive cardiac stress test     borderline positive GXT  . Vision abnormalities   . Headache     History reviewed. No pertinent past surgical history.  Current Outpatient Prescriptions  Medication Sig Dispense Refill  . ALPRAZolam (XANAX) 0.25 MG tablet Take 0.25 mg by mouth every 8 (eight) hours as needed.  0  . Cholecalciferol (VITAMIN D) 2000 UNITS CAPS Take 2,000 Units by mouth daily.    Marland Kitchen escitalopram (LEXAPRO) 10 MG tablet Take 10 mg by mouth daily.    . meclizine (ANTIVERT) 25 MG tablet Take 25 mg by mouth 3 (three) times daily as needed.  1  . Norethindrone-Ethinyl Estradiol-Fe Biphas (LO LOESTRIN FE) 1 MG-10 MCG / 10 MCG tablet Take 1 tablet by mouth daily.    Marland Kitchen omeprazole (PRILOSEC) 20 MG capsule Take 20 mg by mouth 2 (two) times daily before a meal.   6  . vitamin B-12 (CYANOCOBALAMIN) 1000 MCG tablet Take 1,000 mcg by mouth daily.     No current facility-administered medications for this visit.    Allergies as of 01/12/2015 - Review Complete 11/13/2014  Allergen Reaction Noted  . Erythromycin Nausea And Vomiting 12/12/2011  . Latex Swelling 12/12/2011    Vitals: BP 137/90 mmHg  Pulse 101  Ht 5\' 5"  (1.651 m)  Wt 202 lb (91.627 kg)  BMI 33.61 kg/m2 Last  Weight:  Wt Readings from Last 1 Encounters:  01/12/15 202 lb (91.627 kg)   Last Height:   Ht Readings from Last 1 Encounters:  01/12/15 5\' 5"  (1.651 m)     Speech:  Speech is normal; fluent and spontaneous with normal comprehension.  Cognition:  The patient is oriented to person, place, and time;   recent and remote memory intact;   language fluent;   normal attention, concentration,   fund of knowledge Cranial Nerves:  The pupils are equal, round, and reactive to light. The fundi are normal and spontaneous venous pulsations are present. Visual fields are full to finger confrontation. Extraocular movements are intact. Trigeminal sensation is intact and the muscles of mastication are normal. The face is symmetric. The palate elevates in the midline. Hearing intact. Voice is normal. Shoulder shrug is normal. The tongue has normal motion without fasciculations.   Coordination:  Normal finger to nose and heel to shin. Normal rapid alternating movements.   Gait:  Heel-toe and tandem gait are normal.   Motor Observation:  No asymmetry, no atrophy, and no involuntary movements noted. Tone:  Normal muscle tone.   Posture:  Posture is normal. normal erect   Strength:  Strength is V/V in the upper and lower limbs.    Sensation: intact to LT   Reflex Exam:  DTR's:  Deep tendon reflexes in the upper and lower extremities are normal bilaterally.  Toes:  The toes are downgoing bilaterally.  Clonus:  Clonus is absent.     Assessment/Plan: Antonique Langford is a 44 y.o. female with anxiety presents who originally presented to a "body lock up" episode on 10/18/14 lasting one hour. She stated that her arm and leg muscles cramped up, clenched and locked up, as well as painful twitching, she could no tove. She also has daily cramping all over her body. She denies stress or anxiety but appears very anxious in the office  today. Neurology exam is normal.   -Work up to date has been negative including: EEG was normal, EMG/NCS  was normal.  - Extensive lab testing was unremarkable:B12, cbc, tsh, b12, rpr, hiv, cmp, magnesium, sed rate, hep c, heavy metals, ck, ceruloplasmin and copper, lipid. B12 and folate were low-normal so she was started on a supplement.   - MRI with abnormal white matter changes but not likely MS. Will repeat the MRI of the brain in 6 months. - Her B12 awas low normal, she has been taking B12 supplements and will repeat labs. Folate low also taking supplements.  - Will start Lyrica  twice daily.   - cope with stress and anxiety - if it happens again, please keep yourself safe first and then either call 911 or go to nearest ER.  - She would like a second opinion in the group. Will see if Dr. Anne Hahn will provide a review of her workup as I am not sure there is anything else I can offer her.   Naomie Dean, MD  [facility] [address] [place], [place] [address]  Phone [phone] Fax [phone]  A total of 30 minutes was spent face-to-face with this patient. Over half this time was spent on counseling patient on the muscle cramps diagnosis and different diagnostic and therapeutic options available.

## 2015-01-14 ENCOUNTER — Telehealth: Payer: Self-pay | Admitting: Neurology

## 2015-01-14 LAB — B. BURGDORFI ANTIBODIES: Lyme IgG/IgM Ab: <0.91 {ISR} (ref 0.00–0.90)

## 2015-01-14 LAB — B12 AND FOLATE PANEL
Folate: 2.4 ng/mL — ABNORMAL LOW (ref >3.0)
Vitamin B-12: 413 pg/mL (ref 211–946)

## 2015-01-14 LAB — CBC
HEMATOCRIT: 41.3 % (ref 34.0–46.6)
HEMOGLOBIN: 14.1 g/dL (ref 11.1–15.9)
MCH: 31.9 pg (ref 26.6–33.0)
MCHC: 34.1 g/dL (ref 31.5–35.7)
MCV: 93 fL (ref 79–97)
Platelets: 357 10*3/uL (ref 150–379)
RBC: 4.42 x10E6/uL (ref 3.77–5.28)
RDW: 13.6 % (ref 12.3–15.4)
WBC: 12.4 10*3/uL — ABNORMAL HIGH (ref 3.4–10.8)

## 2015-01-14 LAB — METHYLMALONIC ACID, SERUM: Methylmalonic Acid: 92 nmol/L (ref 0–378)

## 2015-01-14 NOTE — Telephone Encounter (Signed)
Please call pateint. Her B12 has improved but her folate is still low. Is she taking folate daily supllement as well? 1mg  per day is usually good but she can take up to 5mg  a day. Her white blood cells are incxreased again, is she taking prednisone? This can increase WBCs. If not she should follow up with her primary care about it thanks.

## 2015-01-18 ENCOUNTER — Encounter (INDEPENDENT_AMBULATORY_CARE_PROVIDER_SITE_OTHER): Payer: Self-pay

## 2015-01-18 ENCOUNTER — Ambulatory Visit (INDEPENDENT_AMBULATORY_CARE_PROVIDER_SITE_OTHER): Payer: BLUE CROSS/BLUE SHIELD | Admitting: Neurology

## 2015-01-18 ENCOUNTER — Encounter: Payer: Self-pay | Admitting: Neurology

## 2015-01-18 VITALS — BP 124/76 | HR 97 | Ht 65.0 in | Wt 202.0 lb

## 2015-01-18 DIAGNOSIS — R252 Cramp and spasm: Secondary | ICD-10-CM | POA: Diagnosis not present

## 2015-01-18 NOTE — Telephone Encounter (Signed)
I spoke to patient and she is aware of results and recommendations. She is not taking steroids right now but will f/u with PCP.

## 2015-01-18 NOTE — Progress Notes (Signed)
Reason for visit: Muscle cramps  Referring physician: Dr. Sherlynn Mcdonald  Karen Mcdonald is a 44 y.o. female  History of present illness:  Karen Mcdonald is a 44 year old right-handed white female with a history of obesity, and an underlying anxiety disorder. The patient has begun having episodes of muscle cramps that began in September 2016. The patient describes unusual events where she will "lock up" and the unable to walk or function  for about one half hour. Along with these episodes, she also reports other symptoms. The patient may have twitches in the muscles that precede the muscle cramps. When the muscle cramps start, she also reports episodes of increased heart rate, shortness of breath, diaphoresis, blanching of the face, and dimming of vision. She feels wiped out after the events. She also reports some chronic issues with low back pain, she has irritable bowel syndrome with alternating diarrhea and constipation. She may go week or more between muscle cramp events. She also reports that she has been running low-grade fevers for several months, her white blood count has been slightly elevated. The patient reports headaches after the muscle cramp events. MRI of the brain apparently has shown some minimal white matter changes, which is nonspecific. The patient has had EMG and nerve conduction studies that were unremarkable. She returns for an evaluation.  Past Medical History  Diagnosis Date  . Dyspnea on exertion   . Atypical chest pain   . Anxiety disorder   . Syncope   . Positive cardiac stress test     borderline positive GXT  . Vision abnormalities   . Headache     Past Surgical History  Procedure Laterality Date  . Breast reduction surgery      Family History  Problem Relation Age of Onset  . AAA (abdominal aortic aneurysm) Mother   . Lung cancer Father   . Leukemia Father   . Tremor Father   . Neuropathy Neg Hx   . Multiple sclerosis Neg Hx     Social history:   reports that she has been smoking.  She has never used smokeless tobacco. She reports that she drinks alcohol. She reports that she does not use illicit drugs.  Medications:  Prior to Admission medications   Medication Sig Start Date End Date Taking? Authorizing Provider  ALPRAZolam (XANAX) 0.25 MG tablet Take 0.25 mg by mouth every 8 (eight) hours as needed. 11/02/14  Yes Historical Provider, MD  Cholecalciferol (VITAMIN D) 2000 UNITS CAPS Take 2,000 Units by mouth daily.   Yes Historical Provider, MD  escitalopram (LEXAPRO) 10 MG tablet Take 10 mg by mouth daily.   Yes Historical Provider, MD  meclizine (ANTIVERT) 25 MG tablet Take 25 mg by mouth 3 (three) times daily as needed. 10/21/14  Yes Historical Provider, MD  Norethindrone-Ethinyl Estradiol-Fe Biphas (LO LOESTRIN FE) 1 MG-10 MCG / 10 MCG tablet Take 1 tablet by mouth daily.   Yes Historical Provider, MD  omeprazole (PRILOSEC) 20 MG capsule Take 20 mg by mouth 2 (two) times daily before a meal.  06/09/14  Yes Historical Provider, MD  pregabalin (LYRICA) 75 MG capsule Take 1 capsule (75 mg total) by mouth 2 (two) times daily. 01/12/15  Yes Anson FretAntonia B Ahern, MD  vitamin B-12 (CYANOCOBALAMIN) 1000 MCG tablet Take 1,000 mcg by mouth daily.   Yes Historical Provider, MD      Allergies  Allergen Reactions  . Erythromycin Nausea And Vomiting  . Latex Swelling    ROS:  Out of  a complete 14 system review of symptoms, the patient complains only of the following symptoms, and all other reviewed systems are negative.  Muscle cramps, headache   Blood pressure 124/76, pulse 97, height  (1.651 m), weight 202 lb (91.627 kg).  Physical Exam  General: The patient is alert and cooperative at the time of the examination. The patient is moderately to markedly obese.  Eyes: Pupils are equal, round, and reactive to light. Discs are flat bilaterally.  Neck: The neck is supple, no carotid bruits are noted.  Respiratory: The respiratory  examination is clear.  Cardiovascular: The cardiovascular examination reveals a regular rate and rhythm, no obvious murmurs or rubs are noted.  Skin: Extremities are without significant edema.  Neurologic Exam  Mental status: The patient is alert and oriented x 3 at the time of the examination. The patient has apparent normal recent and remote memory, with an apparently normal attention span and concentration ability.  Cranial nerves: Facial symmetry is present. There is good sensation of the face to pinprick and soft touch bilaterally. The strength of the facial muscles and the muscles to head turning and shoulder shrug are normal bilaterally. Speech is well enunciated, no aphasia or dysarthria is noted. Extraocular movements are full. Visual fields are full. The tongue is midline, and the patient has symmetric elevation of the soft palate. No obvious hearing deficits are noted.  Motor: The motor testing reveals 5 over 5 strength of all 4 extremities. Good symmetric motor tone is noted throughout.  Sensory: Sensory testing is intact to pinprick, soft touch, vibration sensation, and position sense on all 4 extremities. No evidence of extinction is noted.  Coordination: Cerebellar testing reveals good finger-nose-finger and heel-to-shin bilaterally.  Gait and station: Gait is normal. Tandem gait is normal. Romberg is negative. No drift is seen.  Reflexes: Deep tendon reflexes are symmetric and normal bilaterally, slightly elevated in the legs relative to the arms. Toes are downgoing bilaterally.   Assessment/Plan:  1. Muscle cramps  2. Near syncopal events  3. Anxiety disorder  The patient reports events of muscle cramps, but with the episode she has what appears to be a vasovagal syncopal type event as well. The clinical examination is completely normal. The patient is reporting muscle twitches in the legs today, I was unable to observe any fasciculations. The episodes may be related  to an underlying anxiety or panic disorder. The patient will be sent for further blood work today. She is on Lyrica, if this is not helpful, may consider the use of clonazepam in the future. It is not clear that the patient has a defined neuromuscular disorder.  Marlan Palau MD 01/18/2015 7:21 PM  [facility] [address] [place], [place] [address]  Phone [phone] Fax [phone]

## 2015-01-20 LAB — GLIADIN ANTIBODIES, SERUM
Antigliadin Abs, IgA: 2 units (ref 0–19)
Gliadin IgG: 2 units (ref 0–19)

## 2015-01-20 LAB — ANA W/REFLEX: ANA: NEGATIVE

## 2015-01-20 LAB — CK: Total CK: 100 U/L (ref 24–173)

## 2015-01-20 LAB — GLUTAMIC ACID DECARBOXYLASE AUTO ABS: Glutamic Acid Decarb Ab: <5 U/mL (ref 0.0–5.0)

## 2015-01-20 LAB — SEDIMENTATION RATE: Sed Rate: 9 mm/hr (ref 0–32)

## 2015-01-20 LAB — ANGIOTENSIN CONVERTING ENZYME: ANGIO CONVERT ENZYME: 34 U/L (ref 14–82)

## 2015-01-25 ENCOUNTER — Telehealth: Payer: Self-pay | Admitting: Neurology

## 2015-01-25 NOTE — Telephone Encounter (Signed)
Dr.Ahern call the patient back with Rn at her side. Dr. Lucia GaskinsAhern stated to patient that all her test have been negative. Pt has been seen by Dr. Roda ShuttersXu, Dr Adolph PollackAhern,and Dr. Anne HahnWillis for muscle twitching. Dr. Lucia GaskinsAhern advise the patient to see her PCP for more evaluation via telephone.

## 2015-01-25 NOTE — Telephone Encounter (Signed)
Rn call patient about her lab results. Pt stated someone left a vm stating everything was normal. Pt wanted to know whats the next step after this. Pt has seen Dr.Ahern, and Dr. Anne HahnWillis for her muscle weakness. Pt stated she would like a call back to discuss the plan. Pt stated Dr. Lucia GaskinsAhern and Dr. Anne HahnWillis were suppose to discuss how to move forward.

## 2015-01-25 NOTE — Telephone Encounter (Signed)
Patient is calling and states she received a call about her blood work done on 12/12.  She wants to know what all the blood work was for, the results, and how to move forward.  Please call.

## 2015-01-26 ENCOUNTER — Encounter: Payer: Self-pay | Admitting: Neurology

## 2015-01-26 NOTE — Telephone Encounter (Signed)
Faxed notes/letter to PCP per Dr Lucia Gaskinsahern request.

## 2015-01-26 NOTE — Telephone Encounter (Signed)
Sent a letter to her referring primary care physician with all office notes for review thanks.

## 2015-03-29 LAB — PULMONARY FUNCTION TEST

## 2015-04-30 ENCOUNTER — Encounter: Payer: Self-pay | Admitting: Pulmonary Disease

## 2015-05-03 ENCOUNTER — Ambulatory Visit (INDEPENDENT_AMBULATORY_CARE_PROVIDER_SITE_OTHER)
Admission: RE | Admit: 2015-05-03 | Discharge: 2015-05-03 | Disposition: A | Discharge status: Home / Self Care | Payer: BLUE CROSS/BLUE SHIELD | Source: Ambulatory Visit | Attending: Pulmonary Disease | Admitting: Pulmonary Disease

## 2015-05-03 ENCOUNTER — Encounter: Payer: Self-pay | Admitting: Pulmonary Disease

## 2015-05-03 ENCOUNTER — Ambulatory Visit (INDEPENDENT_AMBULATORY_CARE_PROVIDER_SITE_OTHER): Payer: BLUE CROSS/BLUE SHIELD | Admitting: Pulmonary Disease

## 2015-05-03 VITALS — BP 128/80 | HR 83 | Ht 65.0 in | Wt 206.6 lb

## 2015-05-03 DIAGNOSIS — Z23 Encounter for immunization: Secondary | ICD-10-CM | POA: Diagnosis not present

## 2015-05-03 DIAGNOSIS — R0600 Dyspnea, unspecified: Secondary | ICD-10-CM | POA: Diagnosis not present

## 2015-05-03 DIAGNOSIS — R0689 Other abnormalities of breathing: Secondary | ICD-10-CM

## 2015-05-03 MED ORDER — FLUTICASONE-SALMETEROL 250-50 MCG/DOSE IN AEPB: 1.0000 | INHALATION_SPRAY | Freq: Two times a day (BID) | RESPIRATORY_TRACT | Status: DC

## 2015-05-03 MED ORDER — FLUTICASONE PROPIONATE 50 MCG/ACT NA SUSP: 1.0000 | Freq: Every day | NASAL | Status: DC

## 2015-05-03 NOTE — Patient Instructions (Signed)
We will get a chest x-ray today. You'll be started on a Flonase nasal spray and Advair 250/50.  Return to clinic in 1 month.

## 2015-05-03 NOTE — Progress Notes (Signed)
Subjective:    Patient ID: Karen KluverElizabeth Karen Tashiro, female    DOB: 1970/09/30, 45 y.o.   MRN: [id number]  HPI Consult for evaluation of cough, dyspnea.  Yaakov GuthrieMichelle Mcdonald is a 45 year old with past medical history of depression, anxiety, allergies. She is referred for evaluation of dyspnea for the past several years. This is present on exertion and at rest. Occasional association with wheezing. She has daily cough and produces cheesy-like mucus orange/brown in color. No fevers, chills, hemoptysis. She is a current smoker. She had spirometry in her primary care doctor's office which was normal.  She has history of seasonal allergies, nasal discharge, postnasal drip. She was evaluated for GERD and was tried on PPI twice a day. She tried this for one month and then stopped because it did not make any difference in her symptoms. She works at [employer] and reports significant stress at work.  DATA: PFTs 01/26/16 FVC 3.44 [91%) FEV1 2.89 [95%] F/F 84. Normal spirometry. No bronchial dilator response.  Social history: 1.5 pack year smoking history. She smokes half pack a day.  Family history: Mother-pleuritic in aneurysm, allergies. Father-lung cancer, leukemia  Past Medical History  Diagnosis Date  . Dyspnea on exertion   . Atypical chest pain   . Anxiety disorder   . Syncope   . Positive cardiac stress test     borderline positive GXT  . Vision abnormalities   . Headache   . Major depressive disorder (HCC)   . Vitamin D deficiency     Current outpatient prescriptions:  .  ALPRAZolam (XANAX) 0.25 MG tablet, Take 0.25 mg by mouth every 8 (eight) hours as needed., Disp: , Rfl: 0 .  Cholecalciferol (VITAMIN D) 2000 UNITS CAPS, Take 2,000 Units by mouth daily., Disp: , Rfl:  .  escitalopram (LEXAPRO) 20 MG tablet, Take 20 mg by mouth daily., Disp: , Rfl:  .  magnesium gluconate (MAGONATE) 500 MG tablet, Take 1,000 mg by mouth 2 (two) times daily., Disp: , Rfl:  .   Norethindrone-Ethinyl Estradiol-Fe Biphas (LO LOESTRIN FE) 1 MG-10 MCG / 10 MCG tablet, Take 1 tablet by mouth daily., Disp: , Rfl:  .  vitamin B-12 (CYANOCOBALAMIN) 1000 MCG tablet, Take 1,000 mcg by mouth daily. Reported on 05/03/2015, Disp: , Rfl:   Review of Systems Cough, mucus production, dyspnea, wheezing. No hemoptysis. No nausea, vomiting, diarrhea, consultation. Has some posterior chest pain associated with coughing, no palpitation. No fevers, chills, loss of weight, loss of appetite. All other review of systems negative.    Objective:   Physical Exam Blood pressure 128/80, pulse 83, height 5\' 5"  (1.651 m), weight 206 lb 9.6 oz (93.713 kg), SpO2 98 %. Gen: No apparent distress Neuro: No gross focal deficits. Neck: No JVD, lymphadenopathy, thyromegaly. RS: Clear, No wheeze or crackles CVS: S1-S2 heard, no murmurs rubs gallops. Abdomen: Soft, positive bowel sounds. Extremities: No edema.     Assessment & Plan:  Cough, dyspnea.  She may have reactive airway disease even though the spirometry was normal. She does have allergy symptoms, rhinitis, postnasal drip. I suspect that her anxiety and depression is playing a large role in her presentation as she appears anxious and tremulous in office today. She has been treated maximally for GERD in the past without any success.  I will evaluate by getting a chest x-ray. She'll be tried on a Flonase nasal spray and Advair. She'll return to clinic in 1 month to assess any response to therapy. We can consider sending  her for a methacholine challenge test if she continues to have symptoms.  Plan: - CXR - Trial Flonase, advair.  Return in 1 month.  Chilton Greathouse MD  Pulmonary and Critical Care Pager [phone] If no answer or after 3pm call: [phone] 05/03/2015, 4:41 PM

## 2015-05-28 ENCOUNTER — Encounter: Payer: Self-pay | Admitting: Pulmonary Disease

## 2015-05-28 ENCOUNTER — Ambulatory Visit (INDEPENDENT_AMBULATORY_CARE_PROVIDER_SITE_OTHER): Payer: BLUE CROSS/BLUE SHIELD | Admitting: Pulmonary Disease

## 2015-05-28 VITALS — BP 130/82 | HR 92 | Ht 65.0 in | Wt 208.0 lb

## 2015-05-28 DIAGNOSIS — R0689 Other abnormalities of breathing: Secondary | ICD-10-CM | POA: Diagnosis not present

## 2015-05-28 DIAGNOSIS — R0600 Dyspnea, unspecified: Secondary | ICD-10-CM

## 2015-05-28 MED ORDER — ALBUTEROL SULFATE HFA 108 (90 BASE) MCG/ACT IN AERS: 2.0000 | INHALATION_SPRAY | Freq: Four times a day (QID) | RESPIRATORY_TRACT | Status: DC | PRN

## 2015-05-28 NOTE — Patient Instructions (Signed)
We will start you an an albuterol rescue inhaler Follow up in clinic in 6 months.

## 2015-05-28 NOTE — Progress Notes (Signed)
Subjective:    Patient ID: Karen Mcdonald, female    DOB: Sep 08, 1970, 45 y.o.   MRN: [id number]  HPI Follow up for evaluation of cough, dyspnea.  Karen Mcdonald is a 45 year old with past medical history of depression, anxiety, allergies. She is referred for evaluation of dyspnea for the past several years. This is present on exertion and at rest. Occasional association with wheezing. She has daily cough and produces cheesy-like mucus orange/brown in color. No fevers, chills, hemoptysis. She is a current smoker. She had spirometry in her primary care doctor's office which was normal.  She has history of seasonal allergies, nasal discharge, postnasal drip. She was evaluated for GERD and was tried on PPI twice a day. She tried this for one month and then stopped because it did not make any difference in her symptoms. She works at [employer] and reports significant stress at work.  As last visit she was started on Advair. She does not report and change in symptoms however she developed genital yeast and stopped taking the inhaler. She also reports coughing up of the concha mucus a few days ago. Otherwise her symptoms are unchanged.  DATA: PFTs 01/26/16 FVC 3.44 [91%) FEV1 2.89 [95%] F/F 84. Normal spirometry. No bronchial dilator response.  CXR 05/03/15 Image reviewed. Normal  Social history: 1.5 pack year smoking history. She smokes half pack a day.  Family history: Mother-pleuritic in aneurysm, allergies. Father-lung cancer, leukemia  Past Medical History  Diagnosis Date  . Dyspnea on exertion   . Atypical chest pain   . Anxiety disorder   . Syncope   . Positive cardiac stress test     borderline positive GXT  . Vision abnormalities   . Headache   . Major depressive disorder (HCC)   . Vitamin D deficiency     Current outpatient prescriptions:  .  ALPRAZolam (XANAX) 0.25 MG tablet, Take 0.25 mg by mouth every 8 (eight) hours as needed., Disp: , Rfl:  0 .  Cholecalciferol (VITAMIN D) 2000 UNITS CAPS, Take 2,000 Units by mouth daily., Disp: , Rfl:  .  escitalopram (LEXAPRO) 20 MG tablet, Take 20 mg by mouth daily., Disp: , Rfl:  .  fluticasone (FLONASE) 50 MCG/ACT nasal spray, Place 1 spray into both nostrils daily., Disp: 16 g, Rfl: 6 .  magnesium gluconate (MAGONATE) 500 MG tablet, Take 1,000 mg by mouth 2 (two) times daily., Disp: , Rfl:  .  Norethindrone-Ethinyl Estradiol-Fe Biphas (LO LOESTRIN FE) 1 MG-10 MCG / 10 MCG tablet, Take 1 tablet by mouth daily., Disp: , Rfl:  .  vitamin B-12 (CYANOCOBALAMIN) 1000 MCG tablet, Take 1,000 mcg by mouth daily. Reported on 05/03/2015, Disp: , Rfl:   Review of Systems Cough, mucus production, dyspnea, wheezing. No hemoptysis. No nausea, vomiting, diarrhea, consultation. Has some posterior chest pain associated with coughing, no palpitation. No fevers, chills, loss of weight, loss of appetite. All other review of systems negative.    Objective:   Physical Exam Blood pressure 130/82, pulse 92, height 5\' 5"  (1.651 m), weight 208 lb (94.348 kg), SpO2 98 %. Gen: No apparent distress Neuro: No gross focal deficits. Neck: No JVD, lymphadenopathy, thyromegaly. RS: Clear, No wheeze or crackles CVS: S1-S2 heard, no murmurs rubs gallops. Abdomen: Soft, positive bowel sounds. Extremities: No edema.    Assessment & Plan:  Cough, dyspnea. She may have reactive airway disease even though the spirometry was normal. She does have allergy symptoms, rhinitis, postnasal drip. I suspect that her anxiety  and depression is playing a large role in her presentation as she appears anxious and tremulous in office today.   She is taking flonase and OTC prilosec with improvement in symptoms. She did not tolerated advair due to yeast infections. I will put her on albuterol inh PRN and encouraged her to quit smoking.  Plan: - Continue Flonase, prilosec - D/C advair, start albuterol inhaler PRN.  Return in 1  month.  Chilton Greathouse MD [facility] Pager [phone] If no answer or after 3pm call: [phone] 05/28/2015, 9:55 AM

## 2015-08-23 ENCOUNTER — Other Ambulatory Visit: Payer: Self-pay | Admitting: Obstetrics and Gynecology

## 2015-08-23 DIAGNOSIS — Z9889 Other specified postprocedural states: Secondary | ICD-10-CM

## 2015-08-23 DIAGNOSIS — Z1231 Encounter for screening mammogram for malignant neoplasm of breast: Secondary | ICD-10-CM

## 2015-09-23 ENCOUNTER — Ambulatory Visit
Admission: RE | Admit: 2015-09-23 | Discharge: 2015-09-23 | Disposition: A | Discharge status: Home / Self Care | Payer: BLUE CROSS/BLUE SHIELD | Source: Ambulatory Visit | Attending: Obstetrics and Gynecology | Admitting: Obstetrics and Gynecology

## 2015-09-23 DIAGNOSIS — Z1231 Encounter for screening mammogram for malignant neoplasm of breast: Secondary | ICD-10-CM

## 2015-09-23 DIAGNOSIS — Z9889 Other specified postprocedural states: Secondary | ICD-10-CM

## 2015-10-12 ENCOUNTER — Ambulatory Visit (INDEPENDENT_AMBULATORY_CARE_PROVIDER_SITE_OTHER): Payer: BLUE CROSS/BLUE SHIELD | Admitting: Adult Health

## 2015-10-12 ENCOUNTER — Ambulatory Visit (INDEPENDENT_AMBULATORY_CARE_PROVIDER_SITE_OTHER)
Admission: RE | Admit: 2015-10-12 | Discharge: 2015-10-12 | Disposition: A | Discharge status: Home / Self Care | Payer: BLUE CROSS/BLUE SHIELD | Source: Ambulatory Visit | Attending: Adult Health | Admitting: Adult Health

## 2015-10-12 ENCOUNTER — Encounter: Payer: Self-pay | Admitting: Adult Health

## 2015-10-12 VITALS — BP 124/88 | HR 88 | Temp 98.6°F | Ht 65.0 in | Wt 197.0 lb

## 2015-10-12 DIAGNOSIS — R059 Cough, unspecified: Secondary | ICD-10-CM

## 2015-10-12 DIAGNOSIS — R05 Cough: Secondary | ICD-10-CM

## 2015-10-12 DIAGNOSIS — J209 Acute bronchitis, unspecified: Secondary | ICD-10-CM | POA: Diagnosis not present

## 2015-10-12 HISTORY — DX: Acute bronchitis, unspecified: J20.9

## 2015-10-12 MED ORDER — HYDROCODONE-HOMATROPINE 5-1.5 MG/5ML PO SYRP: 5.0000 mL | ORAL_SOLUTION | Freq: Four times a day (QID) | ORAL | 0 refills | Status: DC | PRN

## 2015-10-12 MED ORDER — AMOXICILLIN-POT CLAVULANATE 875-125 MG PO TABS: 1.0000 | ORAL_TABLET | Freq: Two times a day (BID) | ORAL | 0 refills | Status: AC

## 2015-10-12 NOTE — Patient Instructions (Signed)
Augmentin 875mg  Twice daily  , take with food.  Hydromet 1 tsp every 6hr As needed  Cough , may make you sleepy.  Follow up with Dr. Isaiah SergeMannam in 3 months and As needed   Please contact office for sooner follow up if symptoms do not improve or worsen or seek emergency care

## 2015-10-12 NOTE — Assessment & Plan Note (Signed)
Not resolving with OTC cold meds.  Check cxr  Smoking cessation   Plan  Patient Instructions  Augmentin 875mg  Twice daily  , take with food.  Hydromet 1 tsp every 6hr As needed  Cough , may make you sleepy.  Follow up with Dr. Isaiah SergeMannam in 3 months and As needed   Please contact office for sooner follow up if symptoms do not improve or worsen or seek emergency care

## 2015-10-12 NOTE — Progress Notes (Signed)
Subjective:    Patient ID: Karen Mcdonald, female    DOB: 01-21-1971, 45 y.o.   MRN: [id number]  HPI 45 year old female seen for a consult March 2017 for cough and dyspnea. Felt to have component of reactive airways and active smoking  Test Pulmonary function tests December 2017 showed an FEV1 at 95%, ratio 84, FVC 91% , no significant bronchodilator response  10/12/2015 Acute OV  Patient presents for an acute office visit.  Patient complains of 4 weeks of cough, congestion , nasal drainage. Thick yellow /green mucus.  No fever. No recent abx.  Seen by PCP initially , thought is was allergies. Treated with antihistamines.  On BCP , not pregnant per pt.  She denies any hemoptysis, chest pain calf pain or leg swelling.  Still smoking , discussed cessation.  Using flonase without much help.  Cough is keep her up at night.   Past Medical History:  Diagnosis Date  . Anxiety disorder   . Atypical chest pain   . Dyspnea on exertion   . Headache   . Major depressive disorder (HCC)   . Positive cardiac stress test    borderline positive GXT  . Syncope   . Vision abnormalities   . Vitamin D deficiency    Current Outpatient Prescriptions on File Prior to Visit  Medication Sig Dispense Refill  . albuterol (PROVENTIL HFA;VENTOLIN HFA) 108 (90 Base) MCG/ACT inhaler Inhale 2 puffs into the lungs every 6 (six) hours as needed for wheezing or shortness of breath. 1 Inhaler 6  . ALPRAZolam (XANAX) 0.25 MG tablet Take 0.25 mg by mouth every 8 (eight) hours as needed.  0  . Cholecalciferol (VITAMIN D) 2000 UNITS CAPS Take 2,000 Units by mouth daily.    . fluticasone (FLONASE) 50 MCG/ACT nasal spray Place 1 spray into both nostrils daily. 16 g 6  . magnesium gluconate (MAGONATE) 500 MG tablet Take 1,000 mg by mouth 2 (two) times daily.    . Norethindrone-Ethinyl Estradiol-Fe Biphas (LO LOESTRIN FE) 1 MG-10 MCG / 10 MCG tablet Take 1 tablet by mouth daily.    . vitamin B-12  (CYANOCOBALAMIN) 1000 MCG tablet Take 1,000 mcg by mouth daily. Reported on 05/03/2015    . escitalopram (LEXAPRO) 20 MG tablet Take 20 mg by mouth daily.     No current facility-administered medications on file prior to visit.       Review of Systems Constitutional:   No  weight loss, night sweats,  Fevers, chills, + fatigue, or  lassitude.  HEENT:   No headaches,  Difficulty swallowing,  Tooth/dental problems, or  Sore throat,                No sneezing, itching, ear ache,  +nasal congestion, post nasal drip,   CV:  No chest pain,  Orthopnea, PND, swelling in lower extremities, anasarca, dizziness, palpitations, syncope.   GI  No heartburn, indigestion, abdominal pain, nausea, vomiting, diarrhea, change in bowel habits, loss of appetite, bloody stools.   Resp:    No chest wall deformity  Skin: no rash or lesions.  GU: no dysuria, change in color of urine, no urgency or frequency.  No flank pain, no hematuria   MS:  No joint pain or swelling.  No decreased range of motion.  No back pain.  Psych:  No change in mood or affect. No depression or anxiety.  No memory loss.         Objective:   Physical Exam Vitals:  10/12/15 1423  BP: 124/88  Pulse: 88  Temp: 98.6 F (37 C)  TempSrc: Oral  SpO2: 96%  Weight: 197 lb (89.4 kg)  Height: 5\' 5"  (1.651 m)   GEN: A/Ox3; pleasant , NAD, well nourished , + cough    HEENT:  Flushing/AT,  EACs-clear, TMs-wnl, NOSE-clear, THROAT-clear, no lesions, no postnasal drip or exudate noted.   NECK:  Supple w/ fair ROM; no JVD; normal carotid impulses w/o bruits; no thyromegaly or nodules palpated; no lymphadenopathy.    RESP  Clear  P & A; w/o, wheezes/ rales/ or rhonchi. no accessory muscle use, no dullness to percussion  CARD:  RRR, no m/r/g  , no peripheral edema, pulses intact, no cyanosis or clubbing.  GI:   Soft & nt; nml bowel sounds; no organomegaly or masses detected.   Musco: Warm bil, no deformities or joint swelling noted.    Neuro: alert, no focal deficits noted.    Skin: Warm, no lesions or rashes  Karen Zavalza NP-C  [facility]  10/12/2015        Assessment & Plan:

## 2015-10-13 ENCOUNTER — Telehealth: Payer: Self-pay | Admitting: Adult Health

## 2015-10-13 NOTE — Progress Notes (Signed)
lmtcb x1 

## 2015-10-13 NOTE — Telephone Encounter (Signed)
Notes Recorded by Julio Sicksammy S Parrett, NP on 10/13/2015 at 9:58 AM EDT Lungs clear , no sign of PNA  Cont w/ ov recs Please contact office for sooner follow up if symptoms do not improve or worsen or seek emergency care  --------- Spoke with pt, aware of cxr results/recs.  Nothing further needed.

## 2015-10-13 NOTE — Telephone Encounter (Signed)
Patient calling back. She can be reached at [phone]

## 2015-10-21 ENCOUNTER — Telehealth: Payer: Self-pay | Admitting: Adult Health

## 2015-10-21 MED ORDER — PREDNISONE 10 MG PO TABS: ORAL_TABLET | ORAL | 0 refills | Status: DC

## 2015-10-21 NOTE — Telephone Encounter (Signed)
Pt returned call. She c/o prod cough with yellow/brown mucus, chest congestion, sinus drainage, SOB due to cough during the night. Pt states she refused pred taper at ov and is requesting one to be sent in now since the Augmentin that was given to her on 10/12/15 did not seem to improve her symptoms.   I spoke with TP about the above and she spoke with the patient.  Per verbal order from TP Okay to send pred taper Needs follow up ov if not better Contact our office if symptoms do not improve or seek emergency care.   Pt aware of recs. Rx has been sent to the pharmacy. Nothing further needed.

## 2015-11-08 ENCOUNTER — Telehealth: Payer: Self-pay | Admitting: Pulmonary Disease

## 2015-11-08 NOTE — Telephone Encounter (Signed)
Pt returning call.Karen Mcdonald ° °

## 2015-11-08 NOTE — Telephone Encounter (Signed)
lmtcb x1 for pt. 

## 2015-11-08 NOTE — Telephone Encounter (Signed)
Spoke with the pt  She states her cough is worsening  She is coughing to the point of losing urinary continence  Tearful on the phone  OV with PM tomorrow at 2:45 pm  ED sooner of needed

## 2015-11-09 ENCOUNTER — Encounter: Payer: Self-pay | Admitting: Pulmonary Disease

## 2015-11-09 ENCOUNTER — Ambulatory Visit (INDEPENDENT_AMBULATORY_CARE_PROVIDER_SITE_OTHER): Payer: BLUE CROSS/BLUE SHIELD | Admitting: Pulmonary Disease

## 2015-11-09 VITALS — BP 126/96 | HR 119 | Ht 65.0 in | Wt 198.0 lb

## 2015-11-09 DIAGNOSIS — R0600 Dyspnea, unspecified: Secondary | ICD-10-CM

## 2015-11-09 DIAGNOSIS — R05 Cough: Secondary | ICD-10-CM | POA: Diagnosis not present

## 2015-11-09 DIAGNOSIS — R0689 Other abnormalities of breathing: Secondary | ICD-10-CM

## 2015-11-09 DIAGNOSIS — R059 Cough, unspecified: Secondary | ICD-10-CM

## 2015-11-09 MED ORDER — AZELASTINE-FLUTICASONE 137-50 MCG/ACT NA SUSP: 1.0000 | Freq: Two times a day (BID) | NASAL | 3 refills | Status: DC

## 2015-11-09 MED ORDER — FIRST-DUKES MOUTHWASH MT SUSP: 5.0000 mL | Freq: Four times a day (QID) | OROMUCOSAL | 0 refills | Status: DC

## 2015-11-09 MED ORDER — LEVOFLOXACIN 750 MG PO TABS: 750.0000 mg | ORAL_TABLET | Freq: Every day | ORAL | 0 refills | Status: DC

## 2015-11-09 MED ORDER — HYDROCODONE-HOMATROPINE 5-1.5 MG/5ML PO SYRP: 5.0000 mL | ORAL_SOLUTION | Freq: Four times a day (QID) | ORAL | 0 refills | Status: DC | PRN

## 2015-11-09 NOTE — Progress Notes (Signed)
Karen Mcdonald    [id number]    March 24, 1970  Primary Care Physician:DEWEY,Shyvonne, MD  Referring Physician: Lewis Moccasin, MD [address] [place], [place] [address]  Chief complaint:   Follow up for cough.  HPI: Karen Mcdonald is a 45 year old with past medical history of depression, anxiety, allergies. She is seen for evaluation of dyspnea, chronic cough for the past several years. Occasional association with wheezing.  She has history of seasonal allergies, nasal discharge, postnasal drip. She was evaluated for GERD and was tried on PPI twice a day. She tried this for one month and then stopped because it did not make any difference in her symptoms. She works at [employer] and reports significant stress at work.  She was tried on Advair. She does not report and change in symptoms however she developed genital yeast and stopped taking the inhaler.   Interim history: She was seen last acute visit on September 5 for cough, congestion, nasal drainage. She was initially given a course of Augmentin which did not change her symptoms. She then got up course of prednisone taper with no effect. He returns to the office today complaining of paroxysmal cough, sputum production. Reports fevers up to 103 at home. Symptoms associated with dyspnea, wheezing. She is quite tearful in the office today and feels that she needs to be admitted or get a CT scan of the chest for further evaluation.  Outpatient Encounter Prescriptions as of 11/09/2015  Medication Sig  . ALPRAZolam (XANAX) 0.25 MG tablet Take 0.25 mg by mouth every 8 (eight) hours as needed.  . Cholecalciferol (VITAMIN D) 2000 UNITS CAPS Take 2,000 Units by mouth daily.  . fluticasone (FLONASE) 50 MCG/ACT nasal spray Place 1 spray into both nostrils daily.  Marland Kitchen HYDROcodone-homatropine (HYDROMET) 5-1.5 MG/5ML syrup Take 5 mLs by mouth every 6 (six) hours as needed.  . magnesium gluconate (MAGONATE) 500 MG  tablet Take 1,000 mg by mouth 2 (two) times daily.  . Norethindrone-Ethinyl Estradiol-Fe Biphas (LO LOESTRIN FE) 1 MG-10 MCG / 10 MCG tablet Take 1 tablet by mouth daily.  . vitamin B-12 (CYANOCOBALAMIN) 1000 MCG tablet Take 1,000 mcg by mouth daily. Reported on 05/03/2015  . albuterol (PROVENTIL HFA;VENTOLIN HFA) 108 (90 Base) MCG/ACT inhaler Inhale 2 puffs into the lungs every 6 (six) hours as needed for wheezing or shortness of breath. (Patient not taking: Reported on 11/09/2015)  . escitalopram (LEXAPRO) 20 MG tablet Take 20 mg by mouth daily.  . [DISCONTINUED] predniSONE (DELTASONE) 10 MG tablet Take 4 tabs in am x 2 days, 3 tabs in am x 2 days, 2 tabs in am x 2 days, 1 tabs in am x 2 days then stop   No facility-administered encounter medications on file as of 11/09/2015.     Allergies as of 11/09/2015 - Review Complete 11/09/2015  Allergen Reaction Noted  . Erythromycin Nausea And Vomiting 12/12/2011  . Latex Swelling 12/12/2011    Past Medical History:  Diagnosis Date  . Anxiety disorder   . Atypical chest pain   . Dyspnea on exertion   . Headache   . Major depressive disorder   . Positive cardiac stress test    borderline positive GXT  . Syncope   . Vision abnormalities   . Vitamin D deficiency     Past Surgical History:  Procedure Laterality Date  . BREAST REDUCTION SURGERY    . WISDOM TOOTH EXTRACTION  Family History  Problem Relation Age of Onset  . AAA (abdominal aortic aneurysm) Mother   . Lung cancer Father   . Leukemia Father   . Tremor Father   . Neuropathy Neg Hx   . Multiple sclerosis Neg Hx   . Allergies Mother     Social History   Social History  . Marital status: Single    Spouse name: N/A  . Number of children: 0  . Years of education: 5216   Occupational History  . Mortgage Broker [employer]    Social History Main Topics  . Smoking status: Current Every Day Smoker    Packs/day: 0.50    Years: 25.00    Types: Cigarettes  . Smokeless  tobacco: Never Used  . Alcohol use 0.0 oz/week     Comment: social drinker  . Drug use: No  . Sexual activity: Not on file   Other Topics Concern  . Not on file   Social History Narrative   Patient drinks 1 cup of caffeine daily.   Patient is right handed.      Review of systems: Review of Systems  Constitutional: Negative for fever and chills.  HENT: Negative.   Eyes: Negative for blurred vision.  Respiratory: as per HPI  Cardiovascular: Negative for chest pain and palpitations.  Gastrointestinal: Negative for vomiting, diarrhea, blood per rectum. Genitourinary: Negative for dysuria, urgency, frequency and hematuria.  Musculoskeletal: Negative for myalgias, back pain and joint pain.  Skin: Negative for itching and rash.  Neurological: Negative for dizziness, tremors, focal weakness, seizures and loss of consciousness.  Endo/Heme/Allergies: Negative for environmental allergies.  Psychiatric/Behavioral: Negative for depression, suicidal ideas and hallucinations.  All other systems reviewed and are negative.   Physical Exam: Blood pressure (!) 126/96, pulse (!) 119, height 5\' 5"  (1.651 m), weight 198 lb (89.8 kg), SpO2 94 %. Gen:      No acute distress HEENT:  EOMI, sclera anicteric, + thrush Neck:     No masses; no thyromegaly Lungs:    Clear to auscultation bilaterally; normal respiratory effort CV:         Regular rate and rhythm; no murmurs Abd:      + bowel sounds; soft, non-tender; no palpable masses, no distension Ext:    No edema; adequate peripheral perfusion Skin:      Warm and dry; no rash Neuro: alert and oriented x 3 Psych: normal mood and affect  Data Reviewed: PFTs 03/29/15 FVC 3.44 [91%) FEV1 2.89 [95%] F/F 84. Normal spirometry. No bronchial dilator response.  CXR 10/12/15 No acute abnormality, Images reviewed  Assessment:  Chronic cough Upper airway syndrome Acute bronchitis.  She has an acute episode of bronchitis for the past 1 month that is  not resolved with Augmentin and a steroid course. We will give her another course of Levaquin for 14 days. She has likely upper airway cough syndrome with postnasal drip that is contributing to symptoms. I'll treat her with chlorphentermine and dymista nasal spray. She'll continue on the Hydromet for cough. She has thrush on examination, she be treated with nystatin and Magic mouthwash. I dont think another course of steroids will help.   She is quite tearful in the office today and I suspect her anxiety and depression are contributing in her symptoms. She insists on getting a CT scan of the chest. I will schedule this for about a week from now to give the acute inflammatory response sometime to resolve.  Plan/Recommendations: - Levaquin 750 mg qd  x 14 days - Start chlorpheniramine and dymista nasal spray - Continue hydromet, add tessalon - Give nystatin swish, swallow, magic mouth wash. - CT  Chest without contrast.  Chilton Greathouse MD [facility] Pager [phone] 11/09/2015, 3:17 PM  CC: Lewis Moccasin, MD

## 2015-11-09 NOTE — Patient Instructions (Signed)
We will start you on levaquin 750 mg/day for 14 days Start chlorpheniramine 8 mg tid, change flonase to dymista Continue the hydromet cough syrup We will prescribe nystatin swish and swallow, magic mouth wash. CT scan of chest without contrast.  Return in 2 weeks

## 2015-11-16 ENCOUNTER — Ambulatory Visit (INDEPENDENT_AMBULATORY_CARE_PROVIDER_SITE_OTHER)
Admission: RE | Admit: 2015-11-16 | Discharge: 2015-11-16 | Disposition: A | Discharge status: Home / Self Care | Payer: BLUE CROSS/BLUE SHIELD | Source: Ambulatory Visit | Attending: Pulmonary Disease | Admitting: Pulmonary Disease

## 2015-11-16 DIAGNOSIS — R0689 Other abnormalities of breathing: Secondary | ICD-10-CM | POA: Diagnosis not present

## 2015-11-16 DIAGNOSIS — R0600 Dyspnea, unspecified: Secondary | ICD-10-CM | POA: Diagnosis not present

## 2015-11-18 NOTE — Progress Notes (Signed)
Called spoke with patient, advised of CT results / recs as stated by PM.  Pt verbalized her understanding and denied any questions. 

## 2015-11-23 ENCOUNTER — Encounter: Payer: Self-pay | Admitting: Pulmonary Disease

## 2015-11-23 ENCOUNTER — Ambulatory Visit (INDEPENDENT_AMBULATORY_CARE_PROVIDER_SITE_OTHER): Payer: BLUE CROSS/BLUE SHIELD | Admitting: Pulmonary Disease

## 2015-11-23 VITALS — BP 126/72 | HR 107 | Ht 65.0 in | Wt 195.2 lb

## 2015-11-23 DIAGNOSIS — R0609 Other forms of dyspnea: Secondary | ICD-10-CM | POA: Diagnosis not present

## 2015-11-23 DIAGNOSIS — J209 Acute bronchitis, unspecified: Secondary | ICD-10-CM | POA: Diagnosis not present

## 2015-11-23 LAB — NITRIC OXIDE: NITRIC OXIDE: 9

## 2015-11-23 MED ORDER — FLUCONAZOLE 100 MG PO TABS: ORAL_TABLET | ORAL | 0 refills | Status: DC

## 2015-11-23 MED ORDER — BENZONATATE 200 MG PO CAPS: 200.0000 mg | ORAL_CAPSULE | Freq: Three times a day (TID) | ORAL | 1 refills | Status: DC | PRN

## 2015-11-23 NOTE — Progress Notes (Signed)
Karen Mcdonald    [id number]    [birth year]  Primary Care Physician:DEWEY,Geraldine, MD  Referring Physician: Lewis Moccasin, MD [address] [place], [place] [address]  Chief complaint:   Follow up for cough.  HPI: Karen Mcdonald is a 45 year old with past medical history of depression, anxiety, allergies. She is seen for evaluation of dyspnea, chronic cough for the past several years. Occasional association with wheezing.  She has history of seasonal allergies, nasal discharge, postnasal drip. She was evaluated for GERD and was tried on PPI twice a day. She tried this for one month and then stopped because it did not make any difference in her symptoms. She works at [employer] and reports significant stress at work.  She was tried on Advair. She does not report and change in symptoms however she developed genital yeast and stopped taking the inhaler.    Interim history: She was seen earlier this month for an episode of bronchitis. This was treated with levofloxacin. She feels her symptoms are somewhat better but she still has the persistent nonproductive cough. She is not as tearful in the office today and appears calmer.   Outpatient Encounter Prescriptions as of 11/23/2015  Medication Sig  . albuterol (PROVENTIL HFA;VENTOLIN HFA) 108 (90 Base) MCG/ACT inhaler Inhale 2 puffs into the lungs every 6 (six) hours as needed for wheezing or shortness of breath.  . ALPRAZolam (XANAX) 0.25 MG tablet Take 0.25 mg by mouth every 8 (eight) hours as needed.  . Azelastine-Fluticasone 137-50 MCG/ACT SUSP Place 1 spray into the nose 2 (two) times daily.  . Cholecalciferol (VITAMIN D) 2000 UNITS CAPS Take 2,000 Units by mouth daily.  . Diphenhyd-Hydrocort-Nystatin (FIRST-DUKES MOUTHWASH) SUSP Use as directed 5 mLs in the mouth or throat 4 (four) times daily.  Marland Kitchen escitalopram (LEXAPRO) 20 MG tablet Take 20 mg by mouth daily.  . fluticasone (FLONASE) 50 MCG/ACT  nasal spray Place 1 spray into both nostrils daily.  . magnesium gluconate (MAGONATE) 500 MG tablet Take 1,000 mg by mouth 2 (two) times daily.  . Norethindrone-Ethinyl Estradiol-Fe Biphas (LO LOESTRIN FE) 1 MG-10 MCG / 10 MCG tablet Take 1 tablet by mouth daily.  . vitamin B-12 (CYANOCOBALAMIN) 1000 MCG tablet Take 1,000 mcg by mouth daily. Reported on 05/03/2015  . [DISCONTINUED] HYDROcodone-homatropine (HYDROMET) 5-1.5 MG/5ML syrup Take 5 mLs by mouth every 6 (six) hours as needed. (Patient not taking: Reported on 11/23/2015)  . [DISCONTINUED] levofloxacin (LEVAQUIN) 750 MG tablet Take 1 tablet (750 mg total) by mouth daily. (Patient not taking: Reported on 11/23/2015)   No facility-administered encounter medications on file as of 11/23/2015.     Allergies as of 11/23/2015 - Review Complete 11/23/2015  Allergen Reaction Noted  . Erythromycin Nausea And Vomiting 12/12/2011  . Latex Swelling 12/12/2011    Past Medical History:  Diagnosis Date  . Anxiety disorder   . Atypical chest pain   . Dyspnea on exertion   . Headache   . Major depressive disorder   . Positive cardiac stress test    borderline positive GXT  . Syncope   . Vision abnormalities   . Vitamin D deficiency     Past Surgical History:  Procedure Laterality Date  . BREAST REDUCTION SURGERY    . WISDOM TOOTH EXTRACTION      Family History  Problem Relation Age of Onset  . AAA (abdominal aortic aneurysm) Mother   . Lung cancer Father   .  Leukemia Father   . Tremor Father   . Neuropathy Neg Hx   . Multiple sclerosis Neg Hx   . Allergies Mother     Social History   Social History  . Marital status: Single    Spouse name: N/A  . Number of children: 0  . Years of education: 1816   Occupational History  . Mortgage Broker [employer]    Social History Main Topics  . Smoking status: Current Every Day Smoker    Packs/day: 0.50    Years: 25.00    Types: Cigarettes  . Smokeless tobacco: Never Used  .  Alcohol use 0.0 oz/week     Comment: social drinker  . Drug use: No  . Sexual activity: Not on file   Other Topics Concern  . Not on file   Social History Narrative   Patient drinks 1 cup of caffeine daily.   Patient is right handed.      Review of systems: Review of Systems  Constitutional: Negative for fever and chills.  HENT: Negative.   Eyes: Negative for blurred vision.  Respiratory: as per HPI  Cardiovascular: Negative for chest pain and palpitations.  Gastrointestinal: Negative for vomiting, diarrhea, blood per rectum. Genitourinary: Negative for dysuria, urgency, frequency and hematuria.  Musculoskeletal: Negative for myalgias, back pain and joint pain.  Skin: Negative for itching and rash.  Neurological: Negative for dizziness, tremors, focal weakness, seizures and loss of consciousness.  Endo/Heme/Allergies: Negative for environmental allergies.  Psychiatric/Behavioral: Negative for depression, suicidal ideas and hallucinations.  All other systems reviewed and are negative.   Physical Exam: Blood pressure (!) 126/96, pulse (!) 119, height 5\' 5"  (1.651 m), weight 198 lb (89.8 kg), SpO2 94 %. Gen:      No acute distress HEENT:  EOMI, sclera anicteric, + thrush Neck:     No masses; no thyromegaly Lungs:    Clear to auscultation bilaterally; normal respiratory effort CV:         Regular rate and rhythm; no murmurs Abd:      + bowel sounds; soft, non-tender; no palpable masses, no distension Ext:    No edema; adequate peripheral perfusion Skin:      Warm and dry; no rash Neuro: alert and oriented x 3 Psych: normal mood and affect  Data Reviewed: PFTs 03/29/15 FVC 3.44 [91%) FEV1 2.89 [95%] F/F 84. Normal spirometry. No bronchial dilator response.  CXR 10/12/15 No acute abnormality, Images reviewed  CT chest 11/16/15-no acute abnormality, right middle lobe 4 mm nodule. Images reviewed.  FENO 11/23/15- 9  Assessment:  Chronic cough Upper airway  syndrome Recurrent bronchitis. She is recovering slowly from acute bronchitis for which she received 2 courses of antibiotics including Augmentin, Levaquin and prednisone. She has likely upper airway cough syndrome with postnasal drip that is contributing to symptoms. Her FENO is low was low and PFTs are normal with no broncho dilator response. I don't believe she has has asthma.  I'll continue chlorphentermine and dymista nasal spray. PPI bid has not worked in past. She'll continue on the Hydromet for cough. She has thrush on examination s/p nystatin treatment  but still feels symptomatic. I will give diflucan  Lung nodule She is concerned about the lung nodule on recent CT chest. I reassured her and recommended a repeat CT in 12 months.  However she wants a shorter follow up.   Plan/Recommendations: - Continue chlorpheniramine and dymista nasal spray - Continue hydromet, add tessalon. - Diflucan for thrush - Follow up  CT Chest without contrast in 3 months  Chilton Greathouse MD [facility] Pager [phone] 11/23/2015, 12:22 PM  CC: Lewis Moccasin, MD

## 2015-11-23 NOTE — Patient Instructions (Addendum)
We will check FENO, CT chest without contrast in 3 months We will start you on diflucan 200 mg on day 1 and then 100 mg daily for 2 weeks   Return to clinic after CT scan

## 2015-11-24 ENCOUNTER — Telehealth: Payer: Self-pay | Admitting: Pulmonary Disease

## 2015-11-24 NOTE — Telephone Encounter (Signed)
Attempted to contact patient, left message for patient to return call.

## 2015-11-24 NOTE — Telephone Encounter (Signed)
Pt states she forgot to mention to PM at yesterday's OV. Pt states she would like to know if PM would be willing to write a letter to her employer stating that she shouldn't travel out of state for work in November as pt thinks it would be best to stay as close to us as possible with her current sickness. Pt scheduled to travel out of town the week on 12-19-15 for 1 wk.  PM please advise. Thanks.

## 2015-11-24 NOTE — Telephone Encounter (Signed)
Ok to give the letter.  Also let her know that the diflucan can interfere with estazolam that she is on. We will avoid now and continue with nystatin swish, swallow and magic mouth wash

## 2015-11-24 NOTE — Telephone Encounter (Signed)
Patient aware of Dr. Shirlee MoreMannam's recommendations. Letter created and placed up front for patient to pick up. Patient aware. Nothing further needed.

## 2015-12-20 ENCOUNTER — Telehealth: Payer: Self-pay | Admitting: Pulmonary Disease

## 2015-12-24 NOTE — Telephone Encounter (Signed)
Opened in Error. Seen below phone note.

## 2016-01-17 ENCOUNTER — Ambulatory Visit: Payer: BLUE CROSS/BLUE SHIELD | Admitting: Pulmonary Disease

## 2016-02-02 ENCOUNTER — Ambulatory Visit (INDEPENDENT_AMBULATORY_CARE_PROVIDER_SITE_OTHER)
Admission: RE | Admit: 2016-02-02 | Discharge: 2016-02-02 | Disposition: A | Discharge status: Home / Self Care | Payer: BLUE CROSS/BLUE SHIELD | Source: Ambulatory Visit | Attending: Pulmonary Disease | Admitting: Pulmonary Disease

## 2016-02-02 DIAGNOSIS — R0609 Other forms of dyspnea: Secondary | ICD-10-CM

## 2016-02-02 DIAGNOSIS — J209 Acute bronchitis, unspecified: Secondary | ICD-10-CM

## 2016-02-03 ENCOUNTER — Telehealth: Payer: Self-pay | Admitting: Pulmonary Disease

## 2016-02-03 DIAGNOSIS — R911 Solitary pulmonary nodule: Secondary | ICD-10-CM

## 2016-02-03 NOTE — Telephone Encounter (Signed)
Notes Recorded by Chilton GreathousePraveen Mannam, MD on 02/03/2016 at 2:28 AM EST Please let the patient know that the CT shows that the lung nodules are stable. There is no evidence of malignancy or other lung abnormalities. We will follow up with a repeat CT without contrast in 1 year. Please order  Pt aware of results and voiced her understanding. CT has been order. Nothing further needed.

## 2016-02-17 ENCOUNTER — Telehealth: Payer: Self-pay | Admitting: Pulmonary Disease

## 2016-02-17 NOTE — Telephone Encounter (Signed)
Spoke with pt. She was questioning if she needed to keep her appointment with PM on 02/22/16. This appointment was to go over her CT results. Pt has already been given her CT results. She would like to cancel this appointment and will call back to reschedule if need be. Nothing further was needed.

## 2016-02-18 ENCOUNTER — Encounter: Payer: Self-pay | Admitting: Pulmonary Disease

## 2016-02-18 NOTE — Telephone Encounter (Signed)
Per result note to 12.27.17 CT: Result Notes  Notes Recorded by Eliseo GumMargie A Blankenship, CMA on 02/03/2016 at 2:57 PM EST Pt aware of results and voiced her understanding. Order has been placed for repeat CT in 1 year. Nothing further needed. ------  Notes Recorded by Lorel MonacoLindsay C Lemons, CMA on 02/03/2016 at 9:27 AM EST lmtcb x1 for pt. ------  Notes Recorded by Cydney Okamara N Augustin, CMA on 02/03/2016 at 8:56 AM EST Left message for pt to call back.  ------  Notes Recorded by Chilton GreathousePraveen Mannam, MD on 02/03/2016 at 2:28 AM EST Please let the patient know that the CT shows that the lung nodules are stable. There is no evidence of malignancy or other lung abnormalities. We will follow up with a repeat CT without contrast in 1 year. Please order   Result note stated nodules are 'stable', not unchanged as pt mentioned. As to the size of the initial nodule and the two other nodules not mentioned in the 10.10.17 CT, will send to PM to advise.

## 2016-02-22 ENCOUNTER — Ambulatory Visit: Payer: BLUE CROSS/BLUE SHIELD | Admitting: Pulmonary Disease

## 2016-09-06 ENCOUNTER — Other Ambulatory Visit: Payer: Self-pay | Admitting: Obstetrics and Gynecology

## 2016-09-06 DIAGNOSIS — Z1231 Encounter for screening mammogram for malignant neoplasm of breast: Secondary | ICD-10-CM

## 2016-09-25 ENCOUNTER — Ambulatory Visit: Payer: BLUE CROSS/BLUE SHIELD

## 2016-10-04 ENCOUNTER — Ambulatory Visit
Admission: RE | Admit: 2016-10-04 | Discharge: 2016-10-04 | Disposition: A | Discharge status: Home / Self Care | Payer: BLUE CROSS/BLUE SHIELD | Source: Ambulatory Visit | Attending: Obstetrics and Gynecology | Admitting: Obstetrics and Gynecology

## 2016-10-04 DIAGNOSIS — Z1231 Encounter for screening mammogram for malignant neoplasm of breast: Secondary | ICD-10-CM

## 2016-10-05 ENCOUNTER — Other Ambulatory Visit: Payer: Self-pay | Admitting: Obstetrics and Gynecology

## 2016-10-05 DIAGNOSIS — R928 Other abnormal and inconclusive findings on diagnostic imaging of breast: Secondary | ICD-10-CM

## 2016-10-17 ENCOUNTER — Ambulatory Visit
Admission: RE | Admit: 2016-10-17 | Discharge: 2016-10-17 | Disposition: A | Discharge status: Home / Self Care | Payer: BLUE CROSS/BLUE SHIELD | Source: Ambulatory Visit | Attending: Obstetrics and Gynecology | Admitting: Obstetrics and Gynecology

## 2016-10-17 DIAGNOSIS — R928 Other abnormal and inconclusive findings on diagnostic imaging of breast: Secondary | ICD-10-CM

## 2016-10-27 ENCOUNTER — Telehealth: Payer: Self-pay | Admitting: Pulmonary Disease

## 2016-10-27 NOTE — Telephone Encounter (Signed)
lmtcb x1 for pt. 

## 2016-10-31 NOTE — Telephone Encounter (Signed)
ATC pt, line rang several times until line went silent, unable to leave VM. WCB.

## 2016-11-01 MED ORDER — AZELASTINE-FLUTICASONE 137-50 MCG/ACT NA SUSP: 1.0000 | Freq: Two times a day (BID) | NASAL | 3 refills | Status: DC

## 2016-11-01 NOTE — Telephone Encounter (Signed)
Patient is returning phone call [phone]

## 2016-11-01 NOTE — Telephone Encounter (Signed)
Spoke with the pt  She is requesting Dymista refill  Rx sent  She wants to schedule ov with PM for after her CT Chest- needs to be done end of Dec 2018  PCC's- I see where this order is in, but not scheduled yet- can you go ahead and set her up? Please advise, thanks!

## 2016-11-01 NOTE — Telephone Encounter (Signed)
Attempted to contact pt. No answer, no option to leave a message. Will try back.  

## 2016-11-02 NOTE — Telephone Encounter (Signed)
Called pt to see which location she would like to use for CT - she lives in [place].  Pt states [facility].  Called Stacey & left her vm to call me back to schedule.

## 2016-11-02 NOTE — Telephone Encounter (Signed)
I have scheduled pt's CT at [facility] & left her a vm to call me back for appt info.

## 2016-11-03 NOTE — Telephone Encounter (Signed)
Spoke to pt & gave her appt info.  Nothing further needed. 

## 2017-02-02 ENCOUNTER — Ambulatory Visit (INDEPENDENT_AMBULATORY_CARE_PROVIDER_SITE_OTHER)
Admission: RE | Admit: 2017-02-02 | Discharge: 2017-02-02 | Disposition: A | Discharge status: Home / Self Care | Payer: BLUE CROSS/BLUE SHIELD | Source: Ambulatory Visit | Attending: Pulmonary Disease | Admitting: Pulmonary Disease

## 2017-02-02 DIAGNOSIS — R911 Solitary pulmonary nodule: Secondary | ICD-10-CM | POA: Diagnosis not present

## 2017-02-07 ENCOUNTER — Other Ambulatory Visit: Payer: Self-pay | Admitting: Pulmonary Disease

## 2017-02-07 ENCOUNTER — Telehealth: Payer: Self-pay | Admitting: Pulmonary Disease

## 2017-02-07 DIAGNOSIS — R911 Solitary pulmonary nodule: Secondary | ICD-10-CM

## 2017-02-07 NOTE — Telephone Encounter (Signed)
Notes recorded by Chilton GreathouseMannam, Praveen, MD on 02/03/2017 at 11:39 AM EST Please let the patient know that the nodules are stable. Order follow up CT scan without contrast in Jan 2020. Thanks.  Spoke with pt and notified of results per Dr. Isaiah SergeMannam. Pt verbalized understanding and denied any questions.

## 2017-02-07 NOTE — Progress Notes (Signed)
Spoke with pt and notified of results per Dr. Jarold MottoManamm. Pt verbalized understanding and denied any questions.

## 2017-09-17 ENCOUNTER — Other Ambulatory Visit: Payer: Self-pay | Admitting: Obstetrics and Gynecology

## 2017-09-17 DIAGNOSIS — Z1231 Encounter for screening mammogram for malignant neoplasm of breast: Secondary | ICD-10-CM

## 2017-10-17 ENCOUNTER — Ambulatory Visit
Admission: RE | Admit: 2017-10-17 | Discharge: 2017-10-17 | Disposition: A | Discharge status: Home / Self Care | Payer: BLUE CROSS/BLUE SHIELD | Source: Ambulatory Visit | Attending: Obstetrics and Gynecology | Admitting: Obstetrics and Gynecology

## 2017-10-17 DIAGNOSIS — Z1231 Encounter for screening mammogram for malignant neoplasm of breast: Secondary | ICD-10-CM

## 2018-02-08 ENCOUNTER — Ambulatory Visit (INDEPENDENT_AMBULATORY_CARE_PROVIDER_SITE_OTHER)
Admission: RE | Admit: 2018-02-08 | Discharge: 2018-02-08 | Disposition: A | Discharge status: Home / Self Care | Payer: BLUE CROSS/BLUE SHIELD | Source: Ambulatory Visit | Attending: Pulmonary Disease | Admitting: Pulmonary Disease

## 2018-02-08 DIAGNOSIS — R911 Solitary pulmonary nodule: Secondary | ICD-10-CM

## 2018-02-18 ENCOUNTER — Telehealth: Payer: Self-pay | Admitting: Pulmonary Disease

## 2018-02-18 DIAGNOSIS — J438 Other emphysema: Secondary | ICD-10-CM

## 2018-02-18 NOTE — Telephone Encounter (Signed)
Called and spoke to pt. Pt is requesting additional information on CT.  Pt states that she has reviewed CT via mychart.  Pt states that she is concerned about the new nodule and emphysema that is mentioned. Pt also would like exact nodule sizes compared to last CT. Pt also states that CT does not state high risk, however she has been told that she is high risk. Pt would like f/u imaging prior to 106mo as mentioned in CT.  Dr. Isaiah SergeMannam please advise. Thanks

## 2018-02-18 NOTE — Telephone Encounter (Signed)
I called her but got the voicemail.  Let her know that the CT shows most lung nodules are stable. We can order a follow CT in 3 months but insurance may not cover  CT does show emphysema. I am not sure if she is still smoking She has not been seen in clinic since 2017. Please order PFTs and follow up in clinic to discuss.  Chilton Greathouse MD [facility] 02/18/2018, 1:23 PM

## 2018-02-18 NOTE — Telephone Encounter (Signed)
Pt is aware of results and voiced her understanding.  Pt states she is smoking 0.5 pack daily.  PFT has been ordered.  Pt has been scheduled for OV and PFT on 02/21/18. Will order CT at that visit after discussing, due to insurance possibly not paying for 74mo CT. Nothing further is needed.  Will route to Dr. Isaiah Serge as an Lorain Childes.

## 2018-02-20 ENCOUNTER — Encounter: Payer: Self-pay | Admitting: Pulmonary Disease

## 2018-02-20 ENCOUNTER — Ambulatory Visit (INDEPENDENT_AMBULATORY_CARE_PROVIDER_SITE_OTHER): Payer: BLUE CROSS/BLUE SHIELD | Admitting: Pulmonary Disease

## 2018-02-20 VITALS — BP 118/82 | HR 78 | Ht 64.0 in | Wt 176.6 lb

## 2018-02-20 DIAGNOSIS — J438 Other emphysema: Secondary | ICD-10-CM | POA: Diagnosis not present

## 2018-02-20 DIAGNOSIS — R918 Other nonspecific abnormal finding of lung field: Secondary | ICD-10-CM

## 2018-02-20 LAB — PULMONARY FUNCTION TEST
DL/VA % PRED: 86 %
DL/VA: 4.16 ml/min/mmHg/L
DLCO UNC: 19.36 ml/min/mmHg
DLCO unc % pred: 79 %
RV % pred: 110 %
RV: 1.9 L
TLC % PRED: 104 %
TLC: 5.29 L

## 2018-02-20 MED ORDER — AZITHROMYCIN 250 MG PO TABS: ORAL_TABLET | ORAL | 0 refills | Status: DC

## 2018-02-20 NOTE — Progress Notes (Signed)
Karen Mcdonald    [id number]    09/25/1970  Primary Care Physician:Koirala, Dibas, MD  Referring Physician: Darrow BussingKoirala, Dibas, MD [address] [place], [place] [id number]  Chief complaint:   Follow up for bronchitis, lung nodule, emphysema.  HPI: Karen Mcdonald is a 48 year old with past medical history of depression, anxiety, allergies. She is seen for evaluation of dyspnea, chronic cough for the past several years. Occasional association with wheezing.  She has history of seasonal allergies, nasal discharge, postnasal drip. She was evaluated for GERD and was tried on PPI twice a day. She tried this for one month and then stopped because it did not make any difference in her symptoms. She works at [employer] and reports significant stress at work.  She was tried on Advair. She does not report and change in symptoms however she developed genital yeast and stopped taking the inhaler.    Interim history: Complains of episode of bronchitis since mid December with cough, productive sputum, no fevers, chills She had a CT scan which shows a new lung nodule and is here to discuss results. Continues to smoke up to a pack a day.  Outpatient Encounter Medications as of 02/20/2018  Medication Sig  . ALPRAZolam (XANAX) 0.25 MG tablet Take 0.25 mg by mouth every 8 (eight) hours as needed.  . Cholecalciferol (VITAMIN D) 2000 UNITS CAPS Take 2,000 Units by mouth daily.  Marland Kitchen. escitalopram (LEXAPRO) 20 MG tablet Take 20 mg by mouth daily.  . fluticasone (FLONASE) 50 MCG/ACT nasal spray Place 1 spray into both nostrils daily.  . Norethindrone-Ethinyl Estradiol-Fe Biphas (LO LOESTRIN FE) 1 MG-10 MCG / 10 MCG tablet Take 1 tablet by mouth daily.  . vitamin B-12 (CYANOCOBALAMIN) 1000 MCG tablet Take 1,000 mcg by mouth daily. Reported on 05/03/2015  . albuterol (PROVENTIL HFA;VENTOLIN HFA) 108 (90 Base) MCG/ACT inhaler Inhale 2 puffs into the lungs every 6 (six)  hours as needed for wheezing or shortness of breath. (Patient not taking: Reported on 02/20/2018)  . Azelastine-Fluticasone 137-50 MCG/ACT SUSP Place 1 spray into the nose 2 (two) times daily. (Patient not taking: Reported on 02/20/2018)  . azithromycin (ZITHROMAX) 250 MG tablet Take 2 tablets today, then 1 tablet daily until gone.  . [DISCONTINUED] benzonatate (TESSALON) 200 MG capsule Take 1 capsule (200 mg total) by mouth 3 (three) times daily as needed for cough.  . [DISCONTINUED] Diphenhyd-Hydrocort-Nystatin (FIRST-DUKES MOUTHWASH) SUSP Use as directed 5 mLs in the mouth or throat 4 (four) times daily.  . [DISCONTINUED] fluconazole (DIFLUCAN) 100 MG tablet Take 2 tablets on day 1 then 1 daily for 2 weeks  . [DISCONTINUED] magnesium gluconate (MAGONATE) 500 MG tablet Take 1,000 mg by mouth 2 (two) times daily.   No facility-administered encounter medications on file as of 02/20/2018.    Physical Exam: Blood pressure 118/82, pulse 78, height 5\' 4"  (1.626 m), weight 176 lb 9.6 oz (80.1 kg), SpO2 100 %. Gen:      No acute distress HEENT:  EOMI, sclera anicteric Neck:     No masses; no thyromegaly Lungs:    Clear to auscultation bilaterally; normal respiratory effort CV:         Regular rate and rhythm; no murmurs Abd:      + bowel sounds; soft, non-tender; no palpable masses, no distension Ext:    No edema; adequate peripheral perfusion Skin:      Warm and dry; no rash Neuro: alert and oriented  x 3 Psych: normal mood and affect  Data Reviewed: PFTs 03/29/15 FVC 3.44 [91%) FEV1 2.89 [95%] F/F 84. Normal spirometry. No bronchial dilator response.  PFTs 02/20/2018 SVC 3.29 [94%], TLC 104%, DLCO 79% Minimal reduction in diffusion capacity that corrects for alveolar volume.  CXR 10/12/15 No acute abnormality, Images reviewed  CT chest 11/16/15-no acute abnormality, right middle lobe 4 mm nodule. CT chest 02/02/2016- active pulmonary nodules.  Largest is 5 mm in right middle lobe. CT chest  02/02/17- no pulmonary nodules CT chest 02/08/18- development of left upper lobe 8 mm nodule.  Other pulmonary nodules are stable I have reviewed the images personally.  FENO 11/23/15- 9  Assessment:  Chronic cough Upper airway syndrome Recurrent bronchitis. Has another episode of bronchitis.  Will treat with Z-Pak Avoid prednisone as it causes mood changes. Continue albuterol as needed.  PFTs done today for evaluation of emphysema seen on recent CT chest.  She could not complete spirometry.  We will repeat those when she returns.  Lung nodule She is concerned about the lung nodule on recent CT chest.  Follow-up CT in 3 months after treatment with antibiotics.  Plan/Recommendations: - Z-Pak - Follow up CT Chest without contrast in 3 months - Get spirometry on return visit.  Chilton Greathouse MD [facility] 02/20/2018, 12:25 PM  CC: Darrow Bussing, MD

## 2018-02-20 NOTE — Patient Instructions (Addendum)
We will give a Z-Pak for treatment of bronchitis Get follow-up CT without contrast in 3 months. Order spirometry in 3 months since you are unable to complete it today Follow-up in 3 months after CT scan and spirometry.

## 2018-02-20 NOTE — Progress Notes (Signed)
PFT completed today.  

## 2018-02-21 ENCOUNTER — Ambulatory Visit: Payer: BLUE CROSS/BLUE SHIELD | Admitting: Pulmonary Disease

## 2018-04-15 ENCOUNTER — Telehealth: Payer: Self-pay | Admitting: Pulmonary Disease

## 2018-04-18 NOTE — Telephone Encounter (Signed)
Seems like encounter was open in error. Closing encounter. 

## 2018-04-19 ENCOUNTER — Telehealth: Payer: Self-pay | Admitting: Pulmonary Disease

## 2018-04-19 NOTE — Telephone Encounter (Signed)
Spoke with pt, she states she thought her CT was supposed to be with contrast. I advised her on the AVS it stated CT without contrast but she wanted me to check with Dr. Isaiah Serge to be sure. Dr. Isaiah Serge please advise.   Patient Instructions by Chilton Greathouse, MD at 02/20/2018 11:30 AM  Author: Chilton Greathouse, MD Author Type: Physician Filed: 02/20/2018 12:24 PM  Note Status: Addendum Cosign: Cosign Not Required Encounter Date: 02/20/2018  Editor: Chilton Greathouse, MD (Physician)  Prior Versions: 1. Chilton Greathouse, MD (Physician) at 02/20/2018 12:24 PM - Signed    We will give a Z-Pak for treatment of bronchitis Get follow-up CT without contrast in 3 months. Order spirometry in 3 months since you are unable to complete it today Follow-up in 3 months after CT scan and spirometry.

## 2018-04-22 NOTE — Telephone Encounter (Signed)
LMTCB

## 2018-04-22 NOTE — Telephone Encounter (Signed)
She will need CT scan without contrast as ordered

## 2018-04-23 NOTE — Telephone Encounter (Signed)
Patient returned phone call, she is wanting to clarify why she needs a Chest CT without contrast. She states she thought the purpose was to get the scan with contrast to get better imaging. She would like to talk to Dr. Isaiah Serge directly. She states she is frustrated and does not want to speak with another nurse.  Patient is aware PM is not in clinic but we would get him the message.    Call back [phone]

## 2018-04-24 NOTE — Telephone Encounter (Signed)
I called the patient and clarified that she does not need CT with contrast.   We will proceed a planned with CT without contrast in 3 months All questions from patient were clarified.  Chilton Greathouse MD [facility] 04/24/2018, 10:49 AM

## 2018-04-24 NOTE — Telephone Encounter (Signed)
Noted. Checked and order has been placed. Nothing further needed.

## 2018-05-15 ENCOUNTER — Inpatient Hospital Stay: Admission: RE | Admit: 2018-05-15 | Payer: BLUE CROSS/BLUE SHIELD | Source: Ambulatory Visit

## 2018-05-22 ENCOUNTER — Ambulatory Visit: Payer: BLUE CROSS/BLUE SHIELD | Admitting: Pulmonary Disease

## 2018-05-29 ENCOUNTER — Ambulatory Visit: Payer: BLUE CROSS/BLUE SHIELD | Admitting: Pulmonary Disease

## 2018-07-22 ENCOUNTER — Ambulatory Visit (INDEPENDENT_AMBULATORY_CARE_PROVIDER_SITE_OTHER)
Admission: RE | Admit: 2018-07-22 | Discharge: 2018-07-22 | Disposition: A | Discharge status: Home / Self Care | Payer: BLUE CROSS/BLUE SHIELD | Source: Ambulatory Visit | Attending: Pulmonary Disease | Admitting: Pulmonary Disease

## 2018-07-22 ENCOUNTER — Other Ambulatory Visit: Payer: Self-pay

## 2018-07-22 DIAGNOSIS — R918 Other nonspecific abnormal finding of lung field: Secondary | ICD-10-CM

## 2018-07-29 ENCOUNTER — Emergency Department (HOSPITAL_BASED_OUTPATIENT_CLINIC_OR_DEPARTMENT_OTHER)
Admission: EM | Admit: 2018-07-29 | Discharge: 2018-07-29 | Disposition: A | Discharge status: Home / Self Care | Payer: BC Managed Care – PPO | Attending: Emergency Medicine | Admitting: Emergency Medicine

## 2018-07-29 ENCOUNTER — Emergency Department (HOSPITAL_BASED_OUTPATIENT_CLINIC_OR_DEPARTMENT_OTHER): Payer: BC Managed Care – PPO

## 2018-07-29 ENCOUNTER — Encounter (HOSPITAL_BASED_OUTPATIENT_CLINIC_OR_DEPARTMENT_OTHER): Payer: Self-pay

## 2018-07-29 ENCOUNTER — Other Ambulatory Visit: Payer: Self-pay

## 2018-07-29 DIAGNOSIS — S70372A Other superficial bite of left thigh, initial encounter: Secondary | ICD-10-CM | POA: Diagnosis not present

## 2018-07-29 DIAGNOSIS — S71112A Laceration without foreign body, left thigh, initial encounter: Secondary | ICD-10-CM

## 2018-07-29 DIAGNOSIS — F17210 Nicotine dependence, cigarettes, uncomplicated: Secondary | ICD-10-CM | POA: Insufficient documentation

## 2018-07-29 DIAGNOSIS — Y9301 Activity, walking, marching and hiking: Secondary | ICD-10-CM | POA: Diagnosis not present

## 2018-07-29 DIAGNOSIS — Y929 Unspecified place or not applicable: Secondary | ICD-10-CM | POA: Insufficient documentation

## 2018-07-29 DIAGNOSIS — Z79899 Other long term (current) drug therapy: Secondary | ICD-10-CM | POA: Diagnosis not present

## 2018-07-29 DIAGNOSIS — W540XXA Bitten by dog, initial encounter: Secondary | ICD-10-CM | POA: Diagnosis not present

## 2018-07-29 DIAGNOSIS — Y999 Unspecified external cause status: Secondary | ICD-10-CM | POA: Insufficient documentation

## 2018-07-29 MED ORDER — ONDANSETRON 8 MG PO TBDP
8.0000 mg | ORAL_TABLET | Freq: Once | ORAL | Status: AC
  Administered 2018-07-29: 8 mg via ORAL
  Filled 2018-07-29: qty 1

## 2018-07-29 MED ORDER — AMOXICILLIN-POT CLAVULANATE 875-125 MG PO TABS
1.0000 | ORAL_TABLET | Freq: Once | ORAL | Status: AC
  Administered 2018-07-29: 1 via ORAL
  Filled 2018-07-29: qty 1

## 2018-07-29 MED ORDER — TRAMADOL HCL 50 MG PO TABS
50.0000 mg | ORAL_TABLET | Freq: Once | ORAL | Status: AC
  Administered 2018-07-29: 50 mg via ORAL
  Filled 2018-07-29: qty 1

## 2018-07-29 MED ORDER — IBUPROFEN 600 MG PO TABS: 600.0000 mg | ORAL_TABLET | Freq: Three times a day (TID) | ORAL | 0 refills | Status: DC | PRN

## 2018-07-29 MED ORDER — AMOXICILLIN-POT CLAVULANATE 875-125 MG PO TABS: 1.0000 | ORAL_TABLET | Freq: Two times a day (BID) | ORAL | 0 refills | Status: DC

## 2018-07-29 NOTE — ED Notes (Signed)
Pt appeared upset when advised of wait-pt states her doctor called here and advised her to come here due to there would not be a wait-pt advised we have had up to a 2 hour wait today for non-emergent pts-pt stated after triage that she should have called an ambulance-pt advised that when the ED is full and there is a wait, nonemergent EMS pt are also brought to the ED [place], triaged and wait to be seen

## 2018-07-29 NOTE — ED Triage Notes (Addendum)
Pt states she was bit by neighbor's dog ~1pm-left thigh-area covered with 4x4/kling dsg-pt tearful-limping gait

## 2018-07-29 NOTE — Discharge Instructions (Signed)
It was our pleasure to provide your ER care today - we hope that you feel better.  Take antibiotic (augmentin) as prescribed.  Take ibuprofen and/or acetaminophen as need for pain.   Keep wound very clean/dry.   Have dog observed for the next 2 weeks - if animal appears sick or ill, contact animal control and return to hospital for re-check.   Return to ER right away if worse, new symptoms, fevers, spreading redness, severe or intractable pain, other concern.  You were given pain medication in the ER - no driving for the next 4 hours.

## 2018-07-29 NOTE — ED Provider Notes (Signed)
[facility] EMERGENCY DEPARTMENT Provider Note   CSN: [id number] Arrival date & time: 07/29/18  1558     History   Chief Complaint Chief Complaint  Patient presents with   Animal Bite    HPI Karen Mcdonald is a 48 y.o. female.     Patient s/p dog bite to left thigh early this afternoon. Was walking by neighbors dog/on leash, when dog bite left leg, and pushed to ground. Dog owner indicates imm up to date, and that dog acting normally/healthy, and will be observed. Pt last tetanus approx 10 yrs ago. Patient c/o pain to bite area, moderate, constant, non radiating. No leg numbness/weakness. Bumped forehead when fell. No loc. No headache. No neck or back pain. Denies other pain or injury. No fevers.   The history is provided by the patient.  Animal Bite Associated symptoms: no fever and no numbness     Past Medical History:  Diagnosis Date   Anxiety disorder    Atypical chest pain    Dyspnea on exertion    Headache    Major depressive disorder    Positive cardiac stress test    borderline positive GXT   Syncope    Vision abnormalities    Vitamin D deficiency     Patient Active Problem List   Diagnosis Date Noted   Acute bronchitis 10/12/2015   Muscle cramps 11/13/2014   Paresthesias 11/13/2014   Tremors of nervous system 11/13/2014   Fatigue 11/13/2014   Fall 11/10/2014   Collapse 11/10/2014   Anxiety 11/10/2014   Dyspnea on exertion 05/21/2014   Atypical chest pain 05/21/2014   Syncope 05/21/2014    Past Surgical History:  Procedure Laterality Date   BREAST REDUCTION SURGERY     REDUCTION MAMMAPLASTY Bilateral    WISDOM TOOTH EXTRACTION       OB History   No obstetric history on file.      Home Medications    Prior to Admission medications   Medication Sig Start Date End Date Taking? Authorizing Provider  albuterol (PROVENTIL HFA;VENTOLIN HFA) 108 (90 Base) MCG/ACT inhaler Inhale 2 puffs into the lungs  every 6 (six) hours as needed for wheezing or shortness of breath. Patient not taking: Reported on 02/20/2018 05/28/15   Marshell Garfinkel, MD  ALPRAZolam (XANAX) 0.25 MG tablet Take 0.25 mg by mouth every 8 (eight) hours as needed. 11/02/14   [provider]  Azelastine-Fluticasone 137-50 MCG/ACT SUSP Place 1 spray into the nose 2 (two) times daily. Patient not taking: Reported on 02/20/2018 11/01/16   Tanda Rockers, MD  azithromycin (ZITHROMAX) 250 MG tablet Take 2 tablets today, then 1 tablet daily until gone. 02/20/18   Mannam, Hart Robinsons, MD  Cholecalciferol (VITAMIN D) 2000 UNITS CAPS Take 2,000 Units by mouth daily.    [provider]  escitalopram (LEXAPRO) 20 MG tablet Take 20 mg by mouth daily.    [provider]  fluticasone (FLONASE) 50 MCG/ACT nasal spray Place 1 spray into both nostrils daily. 05/03/15   Mannam, Hart Robinsons, MD  Norethindrone-Ethinyl Estradiol-Fe Biphas (LO LOESTRIN FE) 1 MG-10 MCG / 10 MCG tablet Take 1 tablet by mouth daily.    [provider]  vitamin B-12 (CYANOCOBALAMIN) 1000 MCG tablet Take 1,000 mcg by mouth daily. Reported on 05/03/2015    [provider]    Family History Family History  Problem Relation Age of Onset   AAA (abdominal aortic aneurysm) Mother    Allergies Mother    Lung cancer Father  Leukemia Father    Tremor Father    Neuropathy Neg Hx    Multiple sclerosis Neg Hx    Breast cancer Neg Hx     Social History Social History   Tobacco Use   Smoking status: Current Every Day Smoker    Packs/day: 0.50    Years: 25.00    Pack years: 12.50    Types: Cigarettes   Smokeless tobacco: Never Used  Substance Use Topics   Alcohol use: Yes    Alcohol/week: 0.0 standard drinks    Comment: social drinker   Drug use: No     Allergies   Erythromycin and Latex   Review of Systems Review of Systems  Constitutional: Negative for fever.  HENT: Negative for nosebleeds.   Eyes: Negative for  pain.  Respiratory: Negative for shortness of breath.   Cardiovascular: Negative for chest pain.  Gastrointestinal: Negative for vomiting.  Genitourinary: Negative for flank pain.  Musculoskeletal: Negative for back pain and neck pain.  Skin: Positive for wound.  Neurological: Negative for weakness, numbness and headaches.  Hematological:       No anticoag use.   Psychiatric/Behavioral: Negative for confusion.     Physical Exam Updated Vital Signs BP 140/74 (BP Location: Left Arm)    Pulse (!) 103    Temp 98.4 F (36.9 C) (Oral)    Resp 20    Ht 1.651 m (5\' 5" )    Wt 78.9 kg    LMP 07/15/2018    SpO2 100%    BMI 28.96 kg/m   Physical Exam Vitals signs and nursing note reviewed.  Constitutional:      Appearance: Normal appearance. She is well-developed.  HENT:     Head: Atraumatic.     Comments: No facial or scalp sts or tenderness.     Nose: Nose normal.     Mouth/Throat:     Mouth: Mucous membranes are moist.  Eyes:     General: No scleral icterus.    Conjunctiva/sclera: Conjunctivae normal.     Pupils: Pupils are equal, round, and reactive to light.  Neck:     Musculoskeletal: Normal range of motion and neck supple. No neck rigidity or muscular tenderness.     Trachea: No tracheal deviation.  Cardiovascular:     Rate and Rhythm: Normal rate.     Pulses: Normal pulses.  Pulmonary:     Effort: Pulmonary effort is normal. No respiratory distress.  Abdominal:     General: There is no distension.     Tenderness: There is no abdominal tenderness.  Genitourinary:    Comments: No cva tenderness.  Musculoskeletal:        General: No swelling.     Comments: CTLS spine, non tender, aligned, no step off. Good rom left hip and knee without pain. Distal pulses palp. Dog bite wounds to left anterior thigh. Compartments of leg soft, not tense. No fb seen or felt. No gaping wounds.   Skin:    General: Skin is warm and dry.     Findings: No rash.  Neurological:     Mental  Status: She is alert.     Comments: Alert, speech normal. Motor/sens grossly intact bil, steady gait. LLE nvi.   Psychiatric:        Mood and Affect: Mood normal.      ED Treatments / Results  Labs (all labs ordered are listed, but only abnormal results are displayed) Labs Reviewed - No data to display  EKG  Radiology Dg Femur Min 2 Views Left  Result Date: 07/29/2018 CLINICAL DATA:  48 year old female with history of dog bite to the left upper leg anterolaterally. EXAM: LEFT FEMUR 2 VIEWS COMPARISON:  No priors. FINDINGS: There is no evidence of fracture or other focal bone lesions. Soft tissues are unremarkable. IMPRESSION: Negative. Electronically Signed   By: Trudie Reedaniel  Entrikin M.D.   On: 07/29/2018 19:23    Procedures Procedures (including critical care time)  Medications Ordered in ED Medications  ondansetron (ZOFRAN-ODT) disintegrating tablet 8 mg (has no administration in time range)  traMADol (ULTRAM) tablet 50 mg (has no administration in time range)  amoxicillin-clavulanate (AUGMENTIN) 875-125 MG per tablet 1 tablet (has no administration in time range)     Initial Impression / Assessment and Plan / ED Course  I have reviewed the triage vital signs and the nursing notes.  Pertinent labs & imaging results that were available during my care of the patient were reviewed by me and considered in my medical decision making (see chart for details).  Wounds cleaned. Bacitracin and sterile dressing.   Confirmed only med allergy is emycin.  augmentin po.   Ultram po (pt has ride, does not have to drive home).   zofran po.  Tetanus im.   Xrays.  xrays reviewed by me -  No fx or fb.     Final Clinical Impressions(s) / ED Diagnoses   Final diagnoses:  None    ED Discharge Orders    None       Cathren LaineSteinl, Jesicca Dipierro, MD 07/30/18 1025

## 2018-10-18 ENCOUNTER — Other Ambulatory Visit: Payer: Self-pay | Admitting: Obstetrics and Gynecology

## 2018-10-18 DIAGNOSIS — Z1231 Encounter for screening mammogram for malignant neoplasm of breast: Secondary | ICD-10-CM

## 2018-12-04 ENCOUNTER — Ambulatory Visit: Payer: BC Managed Care – PPO

## 2019-01-21 ENCOUNTER — Inpatient Hospital Stay: Admission: RE | Admit: 2019-01-21 | Payer: BC Managed Care – PPO | Source: Ambulatory Visit

## 2020-08-11 ENCOUNTER — Emergency Department (HOSPITAL_BASED_OUTPATIENT_CLINIC_OR_DEPARTMENT_OTHER)
Admission: EM | Admit: 2020-08-11 | Discharge: 2020-08-12 | Disposition: A | Discharge status: Home / Self Care | Payer: BC Managed Care – PPO | Attending: Emergency Medicine | Admitting: Emergency Medicine

## 2020-08-11 ENCOUNTER — Emergency Department (HOSPITAL_BASED_OUTPATIENT_CLINIC_OR_DEPARTMENT_OTHER): Payer: BC Managed Care – PPO | Admitting: Radiology

## 2020-08-11 ENCOUNTER — Other Ambulatory Visit: Payer: Self-pay

## 2020-08-11 ENCOUNTER — Emergency Department (HOSPITAL_BASED_OUTPATIENT_CLINIC_OR_DEPARTMENT_OTHER): Payer: BC Managed Care – PPO

## 2020-08-11 ENCOUNTER — Encounter (HOSPITAL_BASED_OUTPATIENT_CLINIC_OR_DEPARTMENT_OTHER): Payer: Self-pay

## 2020-08-11 DIAGNOSIS — F17210 Nicotine dependence, cigarettes, uncomplicated: Secondary | ICD-10-CM | POA: Diagnosis not present

## 2020-08-11 DIAGNOSIS — R091 Pleurisy: Secondary | ICD-10-CM

## 2020-08-11 DIAGNOSIS — R0602 Shortness of breath: Secondary | ICD-10-CM | POA: Insufficient documentation

## 2020-08-11 DIAGNOSIS — R059 Cough, unspecified: Secondary | ICD-10-CM | POA: Insufficient documentation

## 2020-08-11 DIAGNOSIS — Z91040 Latex allergy status: Secondary | ICD-10-CM | POA: Insufficient documentation

## 2020-08-11 DIAGNOSIS — R0781 Pleurodynia: Secondary | ICD-10-CM | POA: Diagnosis present

## 2020-08-11 LAB — BASIC METABOLIC PANEL
Anion gap: 10 (ref 5–15)
BUN: 14 mg/dL (ref 6–20)
CO2: 26 mmol/L (ref 22–32)
Calcium: 9.8 mg/dL (ref 8.9–10.3)
Chloride: 104 mmol/L (ref 98–111)
Creatinine, Ser: 0.79 mg/dL (ref 0.44–1.00)
GFR, Estimated: >60 mL/min (ref >60)
Glucose, Bld: 88 mg/dL (ref 70–99)
Potassium: 4.1 mmol/L (ref 3.5–5.1)
Sodium: 140 mmol/L (ref 135–145)

## 2020-08-11 LAB — CBC WITH DIFFERENTIAL/PLATELET
Abs Immature Granulocytes: 0.03 10*3/uL (ref 0.00–0.07)
Basophils Absolute: 0.1 10*3/uL (ref 0.0–0.1)
Basophils Relative: 0 %
Eosinophils Absolute: 0.2 10*3/uL (ref 0.0–0.5)
Eosinophils Relative: 2 %
HCT: 42.1 % (ref 36.0–46.0)
Hemoglobin: 13.9 g/dL (ref 12.0–15.0)
Immature Granulocytes: 0 %
Lymphocytes Relative: 27 %
Lymphs Abs: 3.2 10*3/uL (ref 0.7–4.0)
MCH: 31.9 pg (ref 26.0–34.0)
MCHC: 33 g/dL (ref 30.0–36.0)
MCV: 96.6 fL (ref 80.0–100.0)
Monocytes Absolute: 0.7 10*3/uL (ref 0.1–1.0)
Monocytes Relative: 6 %
Neutro Abs: 7.5 10*3/uL (ref 1.7–7.7)
Neutrophils Relative %: 65 %
Platelets: 388 10*3/uL (ref 150–400)
RBC: 4.36 MIL/uL (ref 3.87–5.11)
RDW: 15.1 % (ref 11.5–15.5)
WBC: 11.6 10*3/uL — ABNORMAL HIGH (ref 4.0–10.5)
nRBC: 0 % (ref 0.0–0.2)

## 2020-08-11 MED ORDER — IOHEXOL 350 MG/ML SOLN
100.0000 mL | Freq: Once | INTRAVENOUS | Status: AC | PRN
  Administered 2020-08-11: 100 mL via INTRAVENOUS

## 2020-08-11 MED ORDER — ONDANSETRON HCL 4 MG/2ML IJ SOLN
4.0000 mg | Freq: Once | INTRAMUSCULAR | Status: AC
  Administered 2020-08-11: 4 mg via INTRAVENOUS
  Filled 2020-08-11: qty 2

## 2020-08-11 MED ORDER — SODIUM CHLORIDE 0.9 % IV SOLN: INTRAVENOUS | Status: DC

## 2020-08-11 MED ORDER — MORPHINE SULFATE (PF) 4 MG/ML IV SOLN
4.0000 mg | Freq: Once | INTRAVENOUS | Status: AC
  Administered 2020-08-11: 4 mg via INTRAVENOUS
  Filled 2020-08-11: qty 1

## 2020-08-11 NOTE — ED Triage Notes (Signed)
Pt c/o shortness of breath with a sharp stabbing pain under right breast on inspiration. Pain has been constant since Sunday.

## 2020-08-11 NOTE — ED Provider Notes (Signed)
[facility] EMERGENCY DEPT Provider Note   CSN: [id number] Arrival date & time: 08/11/20  1908     History Chief Complaint  Patient presents with   Shortness of Breath    Karen Mcdonald is a 50 y.o. female.  50 year old female presents with several days of right-sided sharp pleuritic pain that has been unresponsive to NSAIDs.  Denies any dyspnea.  No leg pain.  No prior history of DVT.  No anginal qualities to it.  Slight cough noted.      Past Medical History:  Diagnosis Date   Anxiety disorder    Atypical chest pain    Dyspnea on exertion    Headache    Major depressive disorder    Positive cardiac stress test    borderline positive GXT   Syncope    Vision abnormalities    Vitamin D deficiency     Patient Active Problem List   Diagnosis Date Noted   Acute bronchitis 10/12/2015   Muscle cramps 11/13/2014   Paresthesias 11/13/2014   Tremors of nervous system 11/13/2014   Fatigue 11/13/2014   Fall 11/10/2014   Collapse 11/10/2014   Anxiety 11/10/2014   Dyspnea on exertion 05/21/2014   Atypical chest pain 05/21/2014   Syncope 05/21/2014    Past Surgical History:  Procedure Laterality Date   BREAST REDUCTION SURGERY     REDUCTION MAMMAPLASTY Bilateral    WISDOM TOOTH EXTRACTION       OB History   No obstetric history on file.     Family History  Problem Relation Age of Onset   AAA (abdominal aortic aneurysm) Mother    Allergies Mother    Lung cancer Father    Leukemia Father    Tremor Father    Neuropathy Neg Hx    Multiple sclerosis Neg Hx    Breast cancer Neg Hx     Social History   Tobacco Use   Smoking status: Every Day    Packs/day: 0.50    Years: 25.00    Pack years: 12.50    Types: Cigarettes   Smokeless tobacco: Never  Vaping Use   Vaping Use: Never used  Substance Use Topics   Alcohol use: Yes    Alcohol/week: 0.0 standard drinks    Comment: social drinker   Drug use: No    Home  Medications Prior to Admission medications   Medication Sig Start Date End Date Taking? Authorizing Provider  albuterol (PROVENTIL HFA;VENTOLIN HFA) 108 (90 Base) MCG/ACT inhaler Inhale 2 puffs into the lungs every 6 (six) hours as needed for wheezing or shortness of breath. Patient not taking: Reported on 02/20/2018 05/28/15   Chilton Greathouse, MD  ALPRAZolam (XANAX) 0.25 MG tablet Take 0.25 mg by mouth every 8 (eight) hours as needed. 11/02/14   [provider]  amoxicillin-clavulanate (AUGMENTIN) 875-125 MG tablet Take 1 tablet by mouth 2 (two) times daily. 07/29/18   Cathren Laine, MD  Azelastine-Fluticasone 137-50 MCG/ACT SUSP Place 1 spray into the nose 2 (two) times daily. Patient not taking: Reported on 02/20/2018 11/01/16   Nyoka Cowden, MD  azithromycin (ZITHROMAX) 250 MG tablet Take 2 tablets today, then 1 tablet daily until gone. 02/20/18   Mannam, Colbert Coyer, MD  Cholecalciferol (VITAMIN D) 2000 UNITS CAPS Take 2,000 Units by mouth daily.    [provider]  escitalopram (LEXAPRO) 20 MG tablet Take 20 mg by mouth daily.    [provider]  fluticasone (FLONASE) 50 MCG/ACT nasal spray Place 1 spray into  both nostrils daily. 05/03/15   Mannam, Colbert Coyer, MD  ibuprofen (ADVIL) 600 MG tablet Take 1 tablet (600 mg total) by mouth every 8 (eight) hours as needed. Take with food. 07/29/18   Cathren Laine, MD  Norethindrone-Ethinyl Estradiol-Fe Biphas (LO LOESTRIN FE) 1 MG-10 MCG / 10 MCG tablet Take 1 tablet by mouth daily.    [provider]  vitamin B-12 (CYANOCOBALAMIN) 1000 MCG tablet Take 1,000 mcg by mouth daily. Reported on 05/03/2015    [provider]    Allergies    Erythromycin, Erythromycin base, and Latex  Review of Systems   Review of Systems  All other systems reviewed and are negative.  Physical Exam Updated Vital Signs BP 123/78   Pulse 88   Temp 98.6 F (37 C) (Oral)   Resp 16   Ht 1.664 m (5' 5.5")   Wt 79.4 kg   LMP  07/18/2020 (Approximate)   SpO2 96%   BMI 28.68 kg/m   Physical Exam Vitals and nursing note reviewed.  Constitutional:      General: She is not in acute distress.    Appearance: Normal appearance. She is well-developed. She is not toxic-appearing.  HENT:     Head: Normocephalic and atraumatic.  Eyes:     General: Lids are normal.     Conjunctiva/sclera: Conjunctivae normal.     Pupils: Pupils are equal, round, and reactive to light.  Neck:     Thyroid: No thyroid mass.     Trachea: No tracheal deviation.  Cardiovascular:     Rate and Rhythm: Normal rate and regular rhythm.     Heart sounds: Normal heart sounds. No murmur heard.   No gallop.  Pulmonary:     Effort: Pulmonary effort is normal. No respiratory distress.     Breath sounds: Normal breath sounds. No stridor. No decreased breath sounds, wheezing, rhonchi or rales.  Abdominal:     General: There is no distension.     Palpations: Abdomen is soft.     Tenderness: There is no abdominal tenderness. There is no rebound.  Musculoskeletal:        General: No tenderness. Normal range of motion.     Cervical back: Normal range of motion and neck supple.  Skin:    General: Skin is warm and dry.     Findings: No abrasion or rash.  Neurological:     Mental Status: She is alert and oriented to person, place, and time. Mental status is at baseline.     GCS: GCS eye subscore is 4. GCS verbal subscore is 5. GCS motor subscore is 6.     Cranial Nerves: Cranial nerves are intact. No cranial nerve deficit.     Sensory: No sensory deficit.     Motor: Motor function is intact.  Psychiatric:        Attention and Perception: Attention normal.        Speech: Speech normal.        Behavior: Behavior normal.    ED Results / Procedures / Treatments   Labs (all labs ordered are listed, but only abnormal results are displayed) Labs Reviewed  CBC WITH DIFFERENTIAL/PLATELET  BASIC METABOLIC PANEL    EKG EKG  Interpretation  Date/Time:  Wednesday August 11 2020 20:10:25 EDT Ventricular Rate:  98 PR Interval:  142 QRS Duration: 82 QT Interval:  338 QTC Calculation: 431 R Axis:   -3 Text Interpretation: Normal sinus rhythm Normal ECG Confirmed by Lorre Nick ([id number]) on 08/11/2020 10:41:42  PM  Radiology DG Chest 2 View  Result Date: 08/11/2020 CLINICAL DATA:  Shortness of breath EXAM: CHEST - 2 VIEW COMPARISON:  CT chest dated 07/22/2018 FINDINGS: Lungs are clear.  No pleural effusion or pneumothorax. The heart is normal in size. Visualized osseous structures are within normal limits. IMPRESSION: Normal chest radiographs. Electronically Signed   By: Charline Bills M.D.   On: 08/11/2020 20:23    Procedures Procedures   Medications Ordered in ED Medications  0.9 %  sodium chloride infusion (has no administration in time range)  morphine 4 MG/ML injection 4 mg (has no administration in time range)    ED Course  I have reviewed the triage vital signs and the nursing notes.  Pertinent labs & imaging results that were available during my care of the patient were reviewed by me and considered in my medical decision making (see chart for details).    MDM Rules/Calculators/A&P                          Patient with pleuritic chest pain suspicious for possible PE.  CT angio chest ordered and patient for pain meds.  Will sign out to Dr. Oletta Cohn Final Clinical Impression(s) / ED Diagnoses Final diagnoses:  None    Rx / DC Orders ED Discharge Orders     None        Lorre Nick, MD 08/11/20 2250

## 2020-08-11 NOTE — ED Notes (Signed)
RT placed PIV in right [facility] w/out difficulty. Secured, flushed, and saline locked.

## 2020-08-12 MED ORDER — METHYLPREDNISOLONE 4 MG PO TBPK: ORAL_TABLET | ORAL | 0 refills | Status: DC

## 2020-08-12 MED ORDER — ALBUTEROL SULFATE HFA 108 (90 BASE) MCG/ACT IN AERS: 2.0000 | INHALATION_SPRAY | RESPIRATORY_TRACT | 2 refills | Status: DC | PRN

## 2020-08-12 NOTE — ED Provider Notes (Signed)
Patient presents to the emergency department for evaluation of right-sided chest pain.  Patient reports pain with taking deep breaths as well as some movements.  Patient was signed out to me by Dr. Freida Busman with CT angiography pending.  CT does not show any evidence of PE.  There are some possible postinflammatory changes.  She is unaware of any recent infections.  We will treat with corticosteroids and albuterol, anti-inflammatory medications.  Follow-up with primary care as needed.   Gilda Crease, MD 08/12/20 [phone]

## 2023-03-06 ENCOUNTER — Other Ambulatory Visit (HOSPITAL_BASED_OUTPATIENT_CLINIC_OR_DEPARTMENT_OTHER): Payer: Self-pay | Admitting: Family Medicine

## 2023-03-06 DIAGNOSIS — R079 Chest pain, unspecified: Secondary | ICD-10-CM

## 2023-03-07 ENCOUNTER — Other Ambulatory Visit: Payer: Self-pay | Admitting: Family Medicine

## 2023-03-07 DIAGNOSIS — Z720 Tobacco use: Secondary | ICD-10-CM

## 2023-03-12 ENCOUNTER — Ambulatory Visit (HOSPITAL_BASED_OUTPATIENT_CLINIC_OR_DEPARTMENT_OTHER)
Admission: RE | Admit: 2023-03-12 | Discharge: 2023-03-12 | Disposition: A | Discharge status: Home / Self Care | Payer: Self-pay | Source: Ambulatory Visit | Attending: Family Medicine | Admitting: Family Medicine

## 2023-03-12 DIAGNOSIS — R079 Chest pain, unspecified: Secondary | ICD-10-CM | POA: Insufficient documentation

## 2023-03-13 ENCOUNTER — Encounter: Payer: Self-pay | Admitting: Family Medicine

## 2023-03-30 ENCOUNTER — Ambulatory Visit
Admission: RE | Admit: 2023-03-30 | Discharge: 2023-03-30 | Disposition: A | Discharge status: Home / Self Care | Payer: Managed Care, Other (non HMO) | Source: Ambulatory Visit | Attending: Family Medicine | Admitting: Family Medicine

## 2023-03-30 DIAGNOSIS — Z720 Tobacco use: Secondary | ICD-10-CM

## 2023-04-24 ENCOUNTER — Other Ambulatory Visit: Payer: Self-pay

## 2023-04-24 ENCOUNTER — Ambulatory Visit

## 2023-04-24 DIAGNOSIS — H539 Unspecified visual disturbance: Secondary | ICD-10-CM | POA: Insufficient documentation

## 2023-04-24 DIAGNOSIS — F329 Major depressive disorder, single episode, unspecified: Secondary | ICD-10-CM | POA: Insufficient documentation

## 2023-04-24 DIAGNOSIS — R079 Chest pain, unspecified: Secondary | ICD-10-CM | POA: Insufficient documentation

## 2023-04-24 DIAGNOSIS — E559 Vitamin D deficiency, unspecified: Secondary | ICD-10-CM | POA: Insufficient documentation

## 2023-04-24 DIAGNOSIS — R519 Headache, unspecified: Secondary | ICD-10-CM | POA: Insufficient documentation

## 2023-04-24 DIAGNOSIS — E6609 Other obesity due to excess calories: Secondary | ICD-10-CM | POA: Insufficient documentation

## 2023-04-24 DIAGNOSIS — F419 Anxiety disorder, unspecified: Secondary | ICD-10-CM | POA: Insufficient documentation

## 2023-04-24 DIAGNOSIS — I10 Essential (primary) hypertension: Secondary | ICD-10-CM | POA: Insufficient documentation

## 2023-04-24 DIAGNOSIS — R9439 Abnormal result of other cardiovascular function study: Secondary | ICD-10-CM | POA: Insufficient documentation

## 2023-04-24 DIAGNOSIS — E66812 Obesity, class 2: Secondary | ICD-10-CM | POA: Insufficient documentation

## 2023-04-24 DIAGNOSIS — Z6838 Body mass index (BMI) 38.0-38.9, adult: Secondary | ICD-10-CM | POA: Insufficient documentation

## 2023-04-25 ENCOUNTER — Ambulatory Visit

## 2023-04-25 VITALS — BP 120/80 | HR 97 | Ht 65.6 in | Wt 215.4 lb

## 2023-04-25 DIAGNOSIS — I10 Essential (primary) hypertension: Secondary | ICD-10-CM | POA: Diagnosis not present

## 2023-04-25 DIAGNOSIS — R079 Chest pain, unspecified: Secondary | ICD-10-CM

## 2023-04-25 DIAGNOSIS — F17200 Nicotine dependence, unspecified, uncomplicated: Secondary | ICD-10-CM | POA: Insufficient documentation

## 2023-04-25 DIAGNOSIS — Z8249 Family history of ischemic heart disease and other diseases of the circulatory system: Secondary | ICD-10-CM | POA: Insufficient documentation

## 2023-04-25 NOTE — Patient Instructions (Signed)
 Medication Instructions:  Your physician recommends that you continue on your current medications as directed. Please refer to the Current Medication list given to you today.  *If you need a refill on your cardiac medications before your next appointment, please call your pharmacy*   Lab Work: None ordered If you have labs (blood work) drawn today and your tests are completely normal, you will receive your results only by: MyChart Message (if you have MyChart) OR A paper copy in the mail If you have any lab test that is abnormal or we need to change your treatment, we will call you to review the results.   Testing/Procedures: Your physician has requested that you have an echocardiogram. Echocardiography is a painless test that uses sound waves to create images of your heart. It provides your doctor with information about the size and shape of your heart and how well your heart's chambers and valves are working. This procedure takes approximately one hour. There are no restrictions for this procedure. Please do NOT wear cologne, perfume, aftershave, or lotions (deodorant is allowed). Please arrive 15 minutes prior to your appointment time.  Please note: We ask at that you not bring children with you during ultrasound (echo/ vascular) testing. Due to room size and safety concerns, children are not allowed in the ultrasound rooms during exams. Our front office staff cannot provide observation of children in our lobby area while testing is being conducted. An adult accompanying a patient to their appointment will only be allowed in the ultrasound room at the discretion of the ultrasound technician under special circumstances. We apologize for any inconvenience.  Your physician has requested that you have an abdominal aorta duplex. During this test, an ultrasound is used to evaluate the aorta. Allow 30 minutes for this exam. Do not eat after midnight the day before and avoid carbonated  beverages.  Please note: We ask at that you not bring children with you during ultrasound (echo/ vascular) testing. Due to room size and safety concerns, children are not allowed in the ultrasound rooms during exams. Our front office staff cannot provide observation of children in our lobby area while testing is being conducted. An adult accompanying a patient to their appointment will only be allowed in the ultrasound room at the discretion of the ultrasound technician under special circumstances. We apologize for any inconvenience.     [facility] [address] [place], [place] [address] Phone:  [phone]  April 25, 2023    Karen Mcdonald DOB: Feb 01, 1971 MRN: [id number] [address] [place] [address]   Dear Karen Mcdonald,  Please arrive 15 minutes prior to your appointment time for registration and insurance purposes.  The test will take approximately 3 to 4 hours to complete; you may bring reading material.  If someone comes with you to your appointment, they will need to remain in the main lobby due to limited space in the testing area. **If you are pregnant or breastfeeding, please notify the nuclear lab prior to your appointment**  How to prepare for your Myocardial Perfusion Test: Do not eat or drink 3 hours prior to your test, except you may have water. Do not consume products containing caffeine (regular or decaffeinated) 12 hours prior to your test. (ex: coffee, chocolate, sodas, tea). Do bring a list of your current medications with you.  If not listed below, you may take your medications as normal. Do wear comfortable clothes (no dresses or overalls) and walking shoes, tennis  shoes preferred (No heels or open toe shoes are allowed). Do NOT wear cologne, perfume, aftershave, or lotions (deodorant is allowed). If these instructions are not followed, your test will have to be rescheduled.  Please report to [address] for your test.  If you have questions or concerns about your appointment, you can call the [facility] at [phone].  If you cannot keep your appointment, please provide 24 hours notification to the Nuclear Lab, to avoid a possible $50 charge to your account.    Follow-Up: At [facility], you and your health needs are our priority.  As part of our continuing mission to provide you with exceptional heart care, we have created designated Provider Care Teams.  These Care Teams include your primary Cardiologist (physician) and Advanced Practice Providers (APPs -  Physician Assistants and Nurse Practitioners) who all work together to provide you with the care you need, when you need it.  We recommend signing up for the patient portal called "MyChart".  Sign up information is provided on this After Visit Summary.  MyChart is used to connect with patients for Virtual Visits (Telemedicine).  Patients are able to view lab/test results, encounter notes, upcoming appointments, etc.  Non-urgent messages can be sent to your provider as well.   To learn more about what you can do with MyChart, go to [email or web address].    Your next appointment:   Follow up based on results  The format for your next appointment:   In Person  Provider:   Vern Claude Madireddy, MD   Other Instructions Echocardiogram An echocardiogram is a test that uses sound waves (ultrasound) to produce images of the heart. Images from an echocardiogram can provide important information about: Heart size and shape. The size and thickness and movement of your heart's walls. Heart muscle function and strength. Heart valve function or if you have stenosis. Stenosis is when the heart valves are too narrow. If blood is flowing backward through the heart valves (regurgitation). A tumor or infectious growth around the heart valves. Areas of heart muscle that are not working well because of  poor blood flow or injury from a heart attack. Aneurysm detection. An aneurysm is a weak or damaged part of an artery wall. The wall bulges out from the normal force of blood pumping through the body. Tell a health care provider about: Any allergies you have. All medicines you are taking, including vitamins, herbs, eye drops, creams, and over-the-counter medicines. Any blood disorders you have. Any surgeries you have had. Any medical conditions you have. Whether you are pregnant or may be pregnant. What are the risks? Generally, this is a safe test. However, problems may occur, including an allergic reaction to dye (contrast) that may be used during the test. What happens before the test? No specific preparation is needed. You may eat and drink normally. What happens during the test? You will take off your clothes from the waist up and put on a hospital gown. Electrodes or electrocardiogram (ECG)patches may be placed on your chest. The electrodes or patches are then connected to a device that monitors your heart rate and rhythm. You will lie down on a table for an ultrasound exam. A gel will be applied to your chest to help sound waves pass through your skin. A handheld device, called a transducer, will be pressed against your chest and moved over your heart. The transducer produces sound waves that travel to your heart and bounce back (  or "echo" back) to the transducer. These sound waves will be captured in real-time and changed into images of your heart that can be viewed on a video monitor. The images will be recorded on a computer and reviewed by your health care provider. You may be asked to change positions or hold your breath for a short time. This makes it easier to get different views or better views of your heart. In some cases, you may receive contrast through an IV in one of your veins. This can improve the quality of the pictures from your heart. The procedure may vary among health  care providers and hospitals.   What can I expect after the test? You may return to your normal, everyday life, including diet, activities, and medicines, unless your health care provider tells you not to do that. Follow these instructions at home: It is up to you to get the results of your test. Ask your health care provider, or the department that is doing the test, when your results will be ready. Keep all follow-up visits. This is important. Summary An echocardiogram is a test that uses sound waves (ultrasound) to produce images of the heart. Images from an echocardiogram can provide important information about the size and shape of your heart, heart muscle function, heart valve function, and other possible heart problems. You do not need to do anything to prepare before this test. You may eat and drink normally. After the echocardiogram is completed, you may return to your normal, everyday life, unless your health care provider tells you not to do that. This information is not intended to replace advice given to you by your health care provider. Make sure you discuss any questions you have with your health care provider. Document Revised: 09/16/2019 Document Reviewed: 09/16/2019 Elsevier Patient Education  2021 Elsevier Inc.   Important Information About Sugar

## 2023-04-25 NOTE — Assessment & Plan Note (Signed)
 Mother with history of abdominal aortic aneurysm and aortic dissection that was fatal in her 65s. Patient herself has risk factors with smoking, high blood pressure.  Will further screen for with an abnormal ultrasound to rule out aneurysm.

## 2023-04-25 NOTE — Assessment & Plan Note (Signed)
 Atypical in description.  She does have cardiovascular risk factors in the form of age however it is reassuring that she had a recent calcium score study that was 0 in February 2025.  Given recent progressive symptoms despite well-controlled blood pressures, will further assess with functional testing using exercise stress test with nuclear imaging.  Advised her to continue taking aspirin 81 mg once daily. If she has any worsening of symptoms are associated with shortness of breath or taking extended time to relieve to proceed to ER or call 911 right away.

## 2023-04-25 NOTE — Progress Notes (Signed)
 Cardiology Consultation:    Date:  04/25/2023   ID:  Karen Mcdonald, DOB [birth year], MRN [id number]  PCP:  Darrow Bussing, MD  Cardiologist:  Luretha Murphy, MD   Referring MD: Sabino Dick, DO   No chief complaint on file.    ASSESSMENT AND PLAN:   Karen Mcdonald 53 year old woman seen today for cardiology consult. Has history of calcium score 0 February 2025, hepatic steatosis noted on calcium score study, hypertension, obesity, depression, panic disorder here for further evaluation of atypical chest pain.  Problem List Items Addressed This Visit     Essential hypertension   Today at the office visit. Target below 130/80 mmHg. Continue current dose hydrochlorothiazide 25 mg once daily and continue to follow-up with PCP.       Relevant Medications   hydrochlorothiazide (HYDRODIURIL) 25 MG tablet   Intermittent chest pain - Primary   Atypical in description.  She does have cardiovascular risk factors in the form of age however it is reassuring that she had a recent calcium score study that was 0 in February 2025.  Given recent progressive symptoms despite well-controlled blood pressures, will further assess with functional testing using exercise stress test with nuclear imaging.  Advised her to continue taking aspirin 81 mg once daily. If she has any worsening of symptoms are associated with shortness of breath or taking extended time to relieve to proceed to ER or call 911 right away.        Relevant Orders   EKG 12-Lead (Completed)   Cardiac Stress Test: Informed Consent Details: Physician/Practitioner Attestation; Transcribe to consent form and obtain patient signature   Family history of abdominal aortic aneurysm   Mother with history of abdominal aortic aneurysm and aortic dissection that was fatal in her 46s. Patient herself has risk factors with smoking, high blood pressure.  Will further screen for with an abnormal ultrasound to rule out  aneurysm.      Relevant Orders   VAS [place] AAA DUPLEX   Smoker   Discussed only about harmful effects of smoking and she is aware and she is working on quitting.  Encouraged her to continue and abstain completely.      Relevant Orders   VAS [place] AAA DUPLEX   Other Visit Diagnoses       Chest pain, unspecified type       Relevant Orders   ECHOCARDIOGRAM COMPLETE   MYOCARDIAL PERFUSION IMAGING   Cardiac Stress Test: Informed Consent Details: Physician/Practitioner Attestation; Transcribe to consent form and obtain patient signature     Return to clinic based on test results.    History of Present Illness:    Karen Mcdonald is a 53 y.o. female who is being seen today for the evaluation of chest pain at the request of Sabino Dick, DO.  Has a history of hypertension, obesity, nonalcoholic fatty liver on read sent calcium score CT scan, recent CT scan coronary calcium score of 0, obesity [BMI 38], depression, panic disorder Has significant family history of mother with aneurysm and later passing away from a aortic dissection complications at age 32.  Lives at home with her husband.  She works as a Clinical biochemist, works from home.  Has a goldendoodle dog that she walks regularly.  For about a month or so she has been describing symptoms of chest pain which are atypical sharp tightness Sensation precordial region under the breast and onto the lateral aspect of the chest, radiating to the back.  Occurring  on and off without any obvious trigger.  No aggravating or relieving factors noted.  At times associated with dizziness.  No syncopal episodes.  No falls.  At times associated with shortness of breath.  Blood pressures at home have been relatively well-controlled with recent switch to hydrochlorothiazide for the past 3 weeks.  Prior to that olmesartan was attempted which she did not tolerate as she felt  a sensation of feeling drunk and running into walls.  Has gained 50  pounds in the past 2 years but over the last 3 weeks now that she started this about has lost 9 pounds.  Takes aspirin occasionally 81 mg once daily.  Has been smoking since 13 years.  Has been trying to cut and now currently less than 1/2 pack a day. Drinks alcohol on social occasions rarely. No substance abuse. Drinks diet Dr. Reino Kent at times.  No energy drink use.  Family history significant for mother with the aortic aneurysm of the abdomen and later passing away from aortic dissection in her 75s.  EKG in the clinic today shows sinus rhythm heart rate 97/min, PR interval 154 ms, QRS duration 82 ms, QTc 447 ms.  EKG at PCPs office sent in with referral notes was similar.  Calcium score study done at [facility] 03-12-2023 noted at score of 0 and extracardiac findings reported hepatic steatosis.   Past Medical History:  Diagnosis Date   Acute bronchitis 10/12/2015   Anxiety 11/10/2014   Anxiety disorder    Atypical chest pain    BMI 38.0-38.9,adult    Collapse 11/10/2014   Dyspnea on exertion    Essential hypertension    Fall 11/10/2014   Fatigue 11/13/2014   Headache    Intermittent chest pain    Major depressive disorder    Muscle cramps 11/13/2014   Obesity, class 2    Other obesity due to excess calories    Paresthesias 11/13/2014   Positive cardiac stress test    borderline positive GXT   Syncope    Tremors of nervous system 11/13/2014   Vision abnormalities    Vitamin D deficiency     Past Surgical History:  Procedure Laterality Date   BREAST REDUCTION SURGERY     REDUCTION MAMMAPLASTY Bilateral    WISDOM TOOTH EXTRACTION      Current Medications: Current Meds  Medication Sig   Cholecalciferol (VITAMIN D) 2000 UNITS CAPS Take 2,000 Units by mouth daily.   clonazePAM (KLONOPIN) 1 MG tablet Take 1 mg by mouth daily as needed for anxiety.   hydrochlorothiazide (HYDRODIURIL) 25 MG tablet Take 25 mg by mouth daily.   imipramine (TOFRANIL-PM) 150 MG capsule  Take 150 mg by mouth at bedtime.   lamoTRIgine (LAMICTAL) 200 MG tablet Take 200 mg by mouth 2 (two) times daily.   nicotine (NICODERM CQ - DOSED IN MG/24 HOURS) 14 mg/24hr patch Place 14 mg onto the skin daily.   tirzepatide (ZEPBOUND) 2.5 MG/0.5ML Pen Inject 2.5 mg into the skin once a week.     Allergies:   Erythromycin, Erythromycin base, Latex, and Mucinex [guaifenesin er]   Social History   Socioeconomic History   Marital status: Single    Spouse name: Not on file   Number of children: 0   Years of education: 16   Highest education level: Not on file  Occupational History   Occupation: Building control surveyor [employer]  Tobacco Use   Smoking status: Every Day    Current packs/day: 0.50    Average  packs/day: 0.5 packs/day for 25.0 years (12.5 ttl pk-yrs)    Types: Cigarettes   Smokeless tobacco: Never  Vaping Use   Vaping status: Never Used  Substance and Sexual Activity   Alcohol use: Yes    Alcohol/week: 0.0 standard drinks of alcohol    Comment: social drinker   Drug use: No   Sexual activity: Not on file  Other Topics Concern   Not on file  Social History Narrative   Patient drinks 1 cup of caffeine daily.   Patient is right handed.    Social Drivers of Corporate investment banker Strain: Not on file  Food Insecurity: Not on file  Transportation Needs: Not on file  Physical Activity: Not on file  Stress: Not on file  Social Connections: Not on file     Family History: The patient's family history includes AAA (abdominal aortic aneurysm) in her mother; Allergies in her mother; Leukemia in her father; Lung cancer in her father; Tremor in her father. There is no history of Neuropathy, Multiple sclerosis, or Breast cancer. ROS:   Please see the history of present illness.    All 14 point review of systems negative except as described per history of present illness.  EKGs/Labs/Other Studies Reviewed:    The following studies were reviewed today:   EKG:  EKG  Interpretation Date/Time:  Wednesday April 25 2023 11:06:08 EDT Ventricular Rate:  97 PR Interval:  154 QRS Duration:  82 QT Interval:  352 QTC Calculation: 447 R Axis:   9  Text Interpretation: Normal sinus rhythm Septal infarct , age undetermined Abnormal ECG When compared with ECG of 11-Aug-2020 20:10, No significant change was found Confirmed by Huntley Dec reddy [phone]) on 04/25/2023 11:34:59 AM    Recent Labs: No results found for requested labs within last 365 days.  Recent Lipid Panel    Component Value Date/Time   CHOL 179 05/21/2014 1001   TRIG 185 (H) 05/21/2014 1001   HDL 40 (L) 05/21/2014 1001   CHOLHDL 4.5 05/21/2014 1001   VLDL 37 05/21/2014 1001   LDLCALC 102 (H) 05/21/2014 1001    Physical Exam:    VS:  BP 120/80   Pulse 97   Ht 5' 5.6" (1.666 m)   Wt 215 lb 6.4 oz (97.7 kg)   SpO2 98%   BMI 35.19 kg/m     Wt Readings from Last 3 Encounters:  04/25/23 215 lb 6.4 oz (97.7 kg)  08/11/20 175 lb (79.4 kg)  07/29/18 174 lb (78.9 kg)     GENERAL:  Well nourished, well developed in no acute distress NECK: No JVD; No carotid bruits CARDIAC: RRR, S1 and S2 present, no murmurs, no rubs, no gallops CHEST:  Clear to auscultation without rales, wheezing or rhonchi  Extremities: No pitting pedal edema. Pulses bilaterally symmetric with radial 2+ and dorsalis pedis 2+ NEUROLOGIC:  Alert and oriented x 3  Medication Adjustments/Labs and Tests Ordered: Current medicines are reviewed at length with the patient today.  Concerns regarding medicines are outlined above.  Orders Placed This Encounter  Procedures   Cardiac Stress Test: Informed Consent Details: Physician/Practitioner Attestation; Transcribe to consent form and obtain patient signature   MYOCARDIAL PERFUSION IMAGING   EKG 12-Lead   ECHOCARDIOGRAM COMPLETE   VAS [place] AAA DUPLEX   No orders of the defined types were placed in this encounter.   Signed, Cecille Amsterdam, MD, MPH,  [facility]. 04/25/2023 12:01 PM    [facility]

## 2023-04-25 NOTE — Assessment & Plan Note (Signed)
 Today at the office visit. Target below 130/80 mmHg. Continue current dose hydrochlorothiazide 25 mg once daily and continue to follow-up with PCP.

## 2023-04-25 NOTE — Assessment & Plan Note (Signed)
 Discussed only about harmful effects of smoking and she is aware and she is working on quitting.  Encouraged her to continue and abstain completely.

## 2023-04-26 ENCOUNTER — Telehealth (HOSPITAL_COMMUNITY): Payer: Self-pay | Admitting: *Deleted

## 2023-04-26 NOTE — Telephone Encounter (Signed)
 Pt given instructions for MPI study.

## 2023-04-30 ENCOUNTER — Other Ambulatory Visit: Payer: Self-pay | Admitting: Family Medicine

## 2023-04-30 DIAGNOSIS — R918 Other nonspecific abnormal finding of lung field: Secondary | ICD-10-CM

## 2023-05-01 ENCOUNTER — Ambulatory Visit

## 2023-05-01 DIAGNOSIS — R079 Chest pain, unspecified: Secondary | ICD-10-CM

## 2023-05-01 MED ORDER — REGADENOSON 0.4 MG/5ML IV SOLN
0.4000 mg | Freq: Once | INTRAVENOUS | Status: AC
Start: 2023-05-01 — End: 2023-05-01
  Administered 2023-05-01: 0.4 mg via INTRAVENOUS

## 2023-05-01 MED ORDER — TECHNETIUM TC 99M TETROFOSMIN IV KIT
31.0000 | PACK | Freq: Once | INTRAVENOUS | Status: AC | PRN
  Administered 2023-05-01: 31 via INTRAVENOUS

## 2023-05-01 MED ORDER — AMINOPHYLLINE 25 MG/ML IV SOLN
75.0000 mg | Freq: Once | INTRAVENOUS | Status: AC
Start: 2023-05-01 — End: 2023-05-01
  Administered 2023-05-01: 75 mg via INTRAVENOUS

## 2023-05-01 MED ORDER — TECHNETIUM TC 99M TETROFOSMIN IV KIT
10.5000 | PACK | Freq: Once | INTRAVENOUS | Status: AC | PRN
  Administered 2023-05-01: 10.5 via INTRAVENOUS

## 2023-05-02 LAB — MYOCARDIAL PERFUSION IMAGING
Angina Index: 0
Estimated workload: 7.7
Exercise duration (min): 6 min
Exercise duration (sec): 15 s
LV dias vol: 61 mL (ref 46–106)
LV sys vol: 23 mL
MPHR: 168 {beats}/min
Nuc Stress EF: 62 %
Peak HR: 130 {beats}/min
Percent HR: 76 %
RPE: 20
Rest HR: 90 {beats}/min
Rest Nuclear Isotope Dose: 10.5 mCi
SDS: 2
SRS: 9
SSS: 11
Stress Nuclear Isotope Dose: 31 mCi
TID: 0.89

## 2023-05-10 ENCOUNTER — Ambulatory Visit (INDEPENDENT_AMBULATORY_CARE_PROVIDER_SITE_OTHER)

## 2023-05-10 ENCOUNTER — Ambulatory Visit

## 2023-05-10 DIAGNOSIS — R079 Chest pain, unspecified: Secondary | ICD-10-CM

## 2023-05-10 DIAGNOSIS — F17200 Nicotine dependence, unspecified, uncomplicated: Secondary | ICD-10-CM

## 2023-05-10 DIAGNOSIS — Z8249 Family history of ischemic heart disease and other diseases of the circulatory system: Secondary | ICD-10-CM

## 2023-05-10 DIAGNOSIS — I10 Essential (primary) hypertension: Secondary | ICD-10-CM | POA: Diagnosis not present

## 2023-05-10 LAB — ECHOCARDIOGRAM COMPLETE
Calc EF: 50.2 %
S' Lateral: 2.8 cm
Single Plane A2C EF: 53.4 %
Single Plane A4C EF: 50 %

## 2023-08-04 ENCOUNTER — Ambulatory Visit: Payer: Self-pay

## 2023-08-14 ENCOUNTER — Encounter: Payer: Self-pay | Admitting: Family Medicine

## 2023-08-15 ENCOUNTER — Ambulatory Visit
Admission: RE | Admit: 2023-08-15 | Discharge: 2023-08-15 | Disposition: A | Discharge status: Home / Self Care | Source: Ambulatory Visit | Attending: Family Medicine | Admitting: Family Medicine

## 2023-08-15 DIAGNOSIS — R918 Other nonspecific abnormal finding of lung field: Secondary | ICD-10-CM

## 2023-12-26 ENCOUNTER — Ambulatory Visit: Admitting: Diagnostic Neuroimaging

## 2024-05-21 ENCOUNTER — Ambulatory Visit: Payer: Self-pay | Admitting: Diagnostic Neuroimaging
